# Patient Record
Sex: Female | Born: 1980 | Race: White | Hispanic: No | Marital: Married | Smoking: Never smoker
Health system: Southern US, Community
[De-identification: ages and names within clinical notes are randomized; demographics above are authoritative.]

## PROBLEM LIST (undated history)

## (undated) DIAGNOSIS — Z789 Other specified health status: Secondary | ICD-10-CM

## (undated) HISTORY — PX: NO PAST SURGERIES: SHX2092

## (undated) HISTORY — PX: OTHER SURGICAL HISTORY: SHX169

---

## 2015-02-15 ENCOUNTER — Ambulatory Visit (INDEPENDENT_AMBULATORY_CARE_PROVIDER_SITE_OTHER): Payer: Worker's Compensation | Admitting: Family Medicine

## 2015-02-15 ENCOUNTER — Ambulatory Visit: Payer: Worker's Compensation

## 2015-02-15 VITALS — BP 108/76 | HR 62 | Temp 98.9°F | Resp 18 | Ht 65.0 in | Wt 130.8 lb

## 2015-02-15 DIAGNOSIS — S93402A Sprain of unspecified ligament of left ankle, initial encounter: Secondary | ICD-10-CM

## 2015-02-15 DIAGNOSIS — M25572 Pain in left ankle and joints of left foot: Secondary | ICD-10-CM | POA: Diagnosis not present

## 2015-02-15 LAB — POCT URINE PREGNANCY: PREG TEST UR: NEGATIVE

## 2015-02-15 MED ORDER — NAPROXEN 500 MG PO TABS: 500.0000 mg | ORAL_TABLET | Freq: Two times a day (BID) | ORAL | Status: DC

## 2015-02-15 NOTE — Patient Instructions (Signed)
Elastic Bandage and RICE WHAT DOES AN ELASTIC BANDAGE DO? Elastic bandages come in different shapes and sizes. They generally provide support to your injury and reduce swelling while you are healing, but they can perform different functions. Your health care provider will help you to decide what is best for your protection, recovery, or rehabilitation following an injury. WHAT ARE SOME GENERAL TIPS FOR USING AN ELASTIC BANDAGE?  Use the bandage as directed by the maker of the bandage that you are using.  Do not wrap the bandage too tightly. This may cut off the circulation in the arm or leg in the area below the bandage.  If part of your body beyond the bandage becomes blue, numb, cold, swollen, or is more painful, your bandage is most likely too tight. If this occurs, remove your bandage and reapply it more loosely.  See your health care provider if the bandage seems to be making your problems worse rather than better.  An elastic bandage should be removed and reapplied every 3-4 hours or as directed by your health care provider. WHAT IS RICE? The routine care of many injuries includes rest, ice, compression, and elevation (RICE therapy).  Rest Rest is required to allow your body to heal. Generally, you can resume your routine activities when you are comfortable and have been given permission by your health care provider. Ice Icing your injury helps to keep the swelling down and it reduces pain. Do not apply ice directly to your skin.  Put ice in a plastic bag.  Place a towel between your skin and the bag.  Leave the ice on for 20 minutes, 2-3 times per day. Do this for as long as you are directed by your health care provider. Compression Compression helps to keep swelling down, gives support, and helps with discomfort. Compression may be done with an elastic bandage. Elevation Elevation helps to reduce swelling and it decreases pain. If possible, your injured area should be placed at  or above the level of your heart or the center of your chest. [place]? You should seek medical care if:  You have persistent pain and swelling.  Your symptoms are getting worse rather than improving. These symptoms may indicate that further evaluation or further X-rays are needed. Sometimes, X-rays may not show a small broken bone (fracture) until a number of days later. Make a follow-up appointment with your health care provider. Ask when your X-ray results will be ready. Make sure that you get your X-ray results. WHEN SHOULD I SEEK IMMEDIATE MEDICAL CARE? You should seek immediate medical care if:  You have a sudden onset of severe pain at or below the area of your injury.  You develop redness or increased swelling around your injury.  You have tingling or numbness at or below the area of your injury that does not improve after you remove the elastic bandage.   This information is not intended to replace advice given to you by your health care provider. Make sure you discuss any questions you have with your health care provider.   Document Released: 10/12/2001 Document Revised: 01/11/2015 Document Reviewed: 12/06/2013 Elsevier Interactive Patient Education [employer].

## 2015-02-15 NOTE — Progress Notes (Signed)
Natalie Jensen 1981-04-28 34 y.o.   Chief Complaint  Patient presents with  . Ankle Injury    Right ankle. Rolled ankle and heard a crack.      Date of Injury: 02/16/2015   History of Present Illness:  Presents for evaluation of work-related complaint. Reports she was doing a jumping jack and upon coming down inverted her ankle and her a "crack." Immediately feel to the ground with right ankle pain.  Since the injury complains of pain with ambulation along with swelling on the lateral right ankle.     Review of Systems  Constitutional: Negative for fever.  Gastrointestinal: Negative for nausea.  Genitourinary: Negative.   Musculoskeletal: Positive for joint pain.  Skin: Negative for rash.  Neurological: Negative for headaches.      Allergies  Allergen Reactions  . Bee Venom Hives  . Sulfa Antibiotics Hives     Current medications reviewed and updated. Past medical history, family history, social history have been reviewed and updated.  Filed Vitals:   02/15/15 1919  BP: 108/76  Pulse: 62  Temp: 98.9 F (37.2 C)  Resp: 18     Physical Exam  Constitutional: She is well-developed, well-nourished, and in no distress. No distress.  Cardiovascular: Normal rate.   Pulmonary/Chest: Effort normal.  Musculoskeletal:       Right ankle: She exhibits swelling. She exhibits no ecchymosis and no deformity. Tenderness. Lateral malleolus, AITFL and proximal fibula tenderness found. No medial malleolus, no CF ligament, no posterior TFL and no head of 5th metatarsal tenderness found. Achilles tendon exhibits no pain.       Left ankle: Normal.  Neurological: No sensory deficit. Gait (antalgic) abnormal.  Skin: She is not diaphoretic.   [facility] reading (PRIMARY) by  Dr. Marin Comment: No acute abnormality of the ankle or fibula.  Bone fragment noted at the distal aspect of the medial malleolus. Distal pant's leg noted just superior to the ankle tib/fib views.     Natalie Jensen was seen today for ankle  injury.  Diagnoses and all orders for this visit:  Left ankle pain: 2/2 problem two. WC note provided with restrictions for 7 days.  She is to RTC in 7 days if her symptoms do not resolve.   -     DG Ankle Complete Right; Future -     DG Tibia/Fibula Right; Future -     POCT urine pregnancy -     Discontinue: naproxen (NAPROSYN) 500 MG tablet; Take 1 tablet (500 mg total) by mouth 2 (two) times daily with a meal. -     naproxen (NAPROSYN) 500 MG tablet; Take 1 tablet (500 mg total) by mouth 2 (two) times daily with a meal.  Ankle sprain, left, initial encounter

## 2015-02-18 NOTE — Progress Notes (Signed)
Agree with A/P. Dr Le 

## 2015-02-22 ENCOUNTER — Telehealth: Payer: Self-pay

## 2015-02-22 NOTE — Telephone Encounter (Addendum)
Patient was seen for workers comp on 02/15/2015. She is supposed to return to work today however she is requesting a note to return on 02/24/2015. If this can be done please fax  to [facility] or Lanier Prude at [phone].  Please advise.   [phone]

## 2015-02-22 NOTE — Telephone Encounter (Signed)
Is this ok? Does pt need to recheck?

## 2015-02-22 NOTE — Telephone Encounter (Signed)
I did not see the patient, Nash Mantis did.  She needs to come back if she is hurting that much from an ankle sprain and needs to be out that long.   Thanks, Dr Marin Comment

## 2015-02-23 ENCOUNTER — Ambulatory Visit (INDEPENDENT_AMBULATORY_CARE_PROVIDER_SITE_OTHER): Payer: Worker's Compensation | Admitting: Family Medicine

## 2015-02-23 VITALS — BP 108/68 | HR 70 | Temp 97.9°F | Resp 16 | Ht 65.0 in | Wt 133.4 lb

## 2015-02-23 DIAGNOSIS — S93401D Sprain of unspecified ligament of right ankle, subsequent encounter: Secondary | ICD-10-CM

## 2015-02-23 NOTE — Progress Notes (Addendum)
Subjective:  This chart was scribed for Natalie Haber MD,  by [name], at [facility].  This patient was seen in room 14 and the patient's care was started at 4:29 PM.   Chief Complaint  Patient presents with   Follow-up    ankle pain, left, workers comp     Patient ID: Natalie Jensen, female    DOB: [birth year], 34 y.o.   MRN: [id number]  HPI  HPI Comments: Natalie Jensen is a 34 y.o. female who presents to the [facility] for a follow up for her right ankle sprain onset 8 days ago while she was doing jumping jacks and inverted her foot.  Patient states that her ankle feels much better now but still has some tenderness.  She has been compliant with her Naproxen.   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  She is requesting a work note today. Patient is willing to return in 7 days.  She has no other concerns today.    There are no active problems to display for this patient.  History reviewed. No pertinent past medical history. History reviewed. No pertinent past surgical history. Allergies  Allergen Reactions   Bee Venom Hives   Sulfa Antibiotics Hives   Prior to Admission medications   Medication Sig Start Date End Date Taking? Authorizing Provider  naproxen (NAPROSYN) 500 MG tablet Take 1 tablet (500 mg total) by mouth 2 (two) times daily with a meal. 02/15/15  Yes Shawnee Knapp, MD   Social History   Social History   Marital Status: Married    Spouse Name: N/A   Number of Children: N/A   Years of Education: N/A   Occupational History   Not on file.   Social History Main Topics   Smoking status: Never Smoker    Smokeless tobacco: Not on file   Alcohol Use: No   Drug Use: No   Sexual Activity: Not on file   Other Topics Concern   Not on file   Social History Narrative         Review of Systems    Constitutional: Negative for fever and chills.  Gastrointestinal: Negative for nausea and vomiting.  Musculoskeletal: Negative for back pain, neck pain and neck stiffness.       Objective:   Physical Exam  Constitutional: She is oriented to person, place, and time. She appears well-developed and well-nourished. No distress.  Eyes: Pupils are equal, round, and reactive to light.  Neck: Normal range of motion.  Cardiovascular: Normal rate.   Pulmonary/Chest: Effort normal. No respiratory distress.  Neurological: She is alert and oriented to person, place, and time.  Skin: Skin is warm and dry.   mild tenderness anterior and posterior to the lateral malleolus. There is diffuse green ecchymosis around the entire right ankle without swelling. She has normal range of motion. Filed Vitals:   02/23/15 1557  BP: 108/68  Pulse: 70  Temp: 97.9 F (36.6 C)  TempSrc: Oral  Resp: 16  Height: 5\' 5"  (1.651 m)  Weight: 133 lb 6.4 oz (60.51 kg)  SpO2: 98%          Assessment & Plan:  This chart was scribed in my presence and reviewed by me personally.    ICD-9-CM ICD-10-CM   1. Right ankle sprain, subsequent encounter V58.89 S93.401D    845.00  Signed, Natalie Haber, MD    Types of activities have been reviewed with the patient at length.

## 2015-02-23 NOTE — Patient Instructions (Signed)
Return next Thursday for follow-up visit

## 2015-02-23 NOTE — Telephone Encounter (Signed)
Spoke with pt, advised her to RTC. She will come in today to see Legrand Como.

## 2015-03-02 ENCOUNTER — Ambulatory Visit (INDEPENDENT_AMBULATORY_CARE_PROVIDER_SITE_OTHER): Payer: Worker's Compensation | Admitting: Family Medicine

## 2015-03-02 VITALS — BP 88/68 | HR 48 | Temp 97.8°F | Resp 16 | Ht 65.0 in | Wt 135.0 lb

## 2015-03-02 DIAGNOSIS — S93401D Sprain of unspecified ligament of right ankle, subsequent encounter: Secondary | ICD-10-CM

## 2015-03-02 NOTE — Progress Notes (Signed)
Subjective:  This chart was scribed for Natalie Haber MD, by [name], at [facility].  This patient was seen in room 14 and the patient's care was started at 1:30 PM.    Chief Complaint  Patient presents with   Follow-up    W/C Right ankle     Patient ID: Natalie Jensen, female    DOB: 13-Nov-1980, 34 y.o.   MRN: [id number]  HPI   HPI Comments: Natalie Jensen is a 34 y.o. female who presents to the [facility] for a follow up regarding her right ankle which she was initially seen here for 15 days ago by Dr. Marin Comment. Patient feels like it is healing but feels like it is very sore with dorsiflexion.  She has not been doing any Zumba.  She has been doing cycling, body weight squats, ab class and upper body work.  She has not been taking any more Naproxen.  She feels like her ankle brace is pinching her a bit so she prefers not to use it.  She would also like a referral for an orthopedic specialist.  Patient is willing to come back for a follow up next week if she is not able to go to the orthopedist.    There are no active problems to display for this patient.  No past medical history on file. No past surgical history on file. Allergies  Allergen Reactions   Bee Venom Hives   Sulfa Antibiotics Hives   Prior to Admission medications   Medication Sig Start Date End Date Taking? Authorizing Provider  naproxen (NAPROSYN) 500 MG tablet Take 1 tablet (500 mg total) by mouth 2 (two) times daily with a meal. Patient not taking: Reported on 03/02/2015 02/15/15   Shawnee Knapp, MD   Social History   Social History   Marital Status: Married    Spouse Name: N/A   Number of Children: N/A   Years of Education: N/A   Occupational History   Not on file.   Social History Main Topics   Smoking status: Never Smoker    Smokeless tobacco: Not on file   Alcohol Use: No   Drug Use: No   Sexual Activity: Not on file   Other Topics Concern    Not on file   Social History Narrative    Review of Systems  Constitutional: Negative for fever and chills.  Respiratory: Negative for cough, choking and shortness of breath.   Gastrointestinal: Negative for nausea and vomiting.  Musculoskeletal: Negative for neck pain and neck stiffness.  Skin: Negative for rash and wound.  Neurological: Negative for syncope and speech difficulty.       Objective:   Physical Exam  Constitutional: She is oriented to person, place, and time. She appears well-developed and well-nourished. No distress.  HENT:  Head: Normocephalic.  Eyes: Pupils are equal, round, and reactive to light.  Neck: Normal range of motion.  Cardiovascular: Normal rate.   Pulmonary/Chest: Effort normal. No respiratory distress.  Musculoskeletal: Normal range of motion.  Tender anterior to the lateral malleolus.   Neurological: She is alert and oriented to person, place, and time.  Skin: Skin is warm and dry.  Psychiatric: She has a normal mood and affect. Her behavior is normal.  Nursing note and vitals reviewed.  Filed Vitals:   03/02/15 1242  BP: 88/68  Pulse: 48  Temp: 97.8 F (36.6 C)  TempSrc: Oral  Resp: 16  Height: 5\' 5"  (1.651  m)  Weight: 135 lb (61.236 kg)  SpO2: 98%    Fading ecchymosis over the lateral malleolus with no bony abnormality      Assessment & Plan:   This chart was scribed in my presence and reviewed by me personally.    ICD-9-CM ICD-10-CM   1. Right ankle sprain, subsequent encounter V58.89 S93.401D Ambulatory referral to Orthopedic Surgery   845.00     Follow-up one week  Signed, Natalie Haber, MD

## 2015-03-08 ENCOUNTER — Ambulatory Visit (INDEPENDENT_AMBULATORY_CARE_PROVIDER_SITE_OTHER): Payer: Worker's Compensation | Admitting: Family Medicine

## 2015-03-08 VITALS — BP 100/68 | HR 62 | Temp 98.4°F | Resp 18 | Ht 65.0 in | Wt 133.0 lb

## 2015-03-08 DIAGNOSIS — S93401D Sprain of unspecified ligament of right ankle, subsequent encounter: Secondary | ICD-10-CM

## 2015-03-08 NOTE — Progress Notes (Signed)
   This chart was scribed for [name], MD by Moises Blood, medical scribe at [facility].The patient was seen in exam room 12 and the patient's care was started at 2:31 PM.  Patient ID: Natalie Jensen MRN: [id number], DOB: 02-28-1981, 34 y.o. Date of Encounter: 03/08/2015  Primary Physician: Tawanna Solo, MD  Chief Complaint:  Chief Complaint  Patient presents with  . Follow-up    rt ankle injury  . Ankle Injury    HPI:  Natalie Jensen is a 34 y.o. female who presents to [facility] for follow up of right ankle injury with Workers comp. She initially injured her foot while doing jumping jacks and inverted her foot. I last saw her a week ago.  She was in a dance class yesterday and still feels some pain in her right ankle.   She has not gotten a call from orthopedic.   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   Allergies:  Allergies  Allergen Reactions  . Bee Venom Hives  . Sulfa Antibiotics Hives     Review of Systems: Constitutional: negative for fever, chills, night sweats, weight changes, or fatigue  HEENT: negative for vision changes, hearing loss, congestion, rhinorrhea, ST, epistaxis, or sinus pressure Cardiovascular: negative for chest pain or palpitations Respiratory: negative for hemoptysis, wheezing, shortness of breath, or cough Abdominal: negative for abdominal pain, nausea, vomiting, diarrhea, or constipation Dermatological: negative for rash Neurologic: negative for headache, dizziness, or syncope Musc: positive for arthralgia (right ankle) All other systems reviewed and are otherwise negative with the exception to those above and in the HPI.  Physical Exam: Blood pressure 100/68, pulse 62, temperature 98.4 F (36.9 C), temperature source Oral, resp. rate 18, height 5\' 5"  (1.651 m), weight 133 lb (60.328 kg), last menstrual period 07/12/2014, SpO2 97 %., Body mass index is 22.13  kg/(m^2). General: Well developed, well nourished, in no acute distress. Head: Normocephalic, atraumatic, eyes without discharge, sclera non-icteric, nares are without discharge. Bilateral auditory canals clear, TM's are without perforation, pearly grey and translucent with reflective cone of light bilaterally. Oral cavity moist, posterior pharynx without exudate, erythema, peritonsillar abscess, or post nasal drip.  Neck: Supple. No thyromegaly. Full ROM. No lymphadenopathy. Lungs: Clear bilaterally to auscultation without wheezes, rales, or rhonchi. Breathing is unlabored. Heart: RRR with S1 S2. No murmurs, rubs, or gallops appreciated. Abdomen: Soft, non-tender, non-distended with normoactive bowel sounds. No hepatomegaly. No rebound/guarding. No obvious abdominal masses. Msk:  Strength and tone normal for age. Extremities/Skin: Warm and dry. Right ankle: mild swelling just anterior to distal fibula, pain with extreme dorsi flexion of the ankle  Neuro: Alert and oriented X 3. Moves all extremities spontaneously. Gait is normal. CNII-XII grossly in tact. Psych:  Responds to questions appropriately with a normal affect.     ASSESSMENT AND PLAN:  34 y.o. year old female with  This chart was scribed in my presence and reviewed by me personally.    ICD-9-CM ICD-10-CM   1. Right ankle sprain, subsequent encounter V58.89 S93.401D Ambulatory referral to Physical Therapy   845.00      By signing my name below, I, Moises Blood, attest that this documentation has been prepared under the direction and in the presence of [name], MD. Electronically Signed: Moises Blood, [place]. 03/08/2015 , 2:31 PM .  Signed, [name], MD 03/08/2015 2:31 PM

## 2015-03-09 ENCOUNTER — Telehealth: Payer: Self-pay

## 2015-03-09 NOTE — Telephone Encounter (Signed)
At the patient's request, I made a copy of her x ray on CD and placed in the pick up drawer.

## 2015-03-09 NOTE — Telephone Encounter (Signed)
Request printed and taken to x-ray. Per Rodi, canopy is down and unable to complete request at this time. When it gets fixed, she will make x-ray disc.

## 2015-03-09 NOTE — Telephone Encounter (Signed)
Patient is scheduled to see an ortho, and will need to take xray disc with her Please make an xray disc of the RT Ankle and RT Tibia/Fibula done on 02/15/15. She will pick up today at 5:00

## 2017-05-06 NOTE — L&D Delivery Note (Signed)
Delivery Note At 12:32 PM a viable female was delivered via  (Presentation: OA ).  APGAR: 8,9 ; weight: pending.   Placenta status: to L&D.  Cord:  with the following complications: .  Cord pH: n/a  Anesthesia:  epidural Episiotomy:  n/a Lacerations:  1st degree laceration Suture Repair: 3.0 vicryl Est. Blood Loss (mL):  50cc  Mom to postpartum.  Baby to Couplet care / Skin to Skin.  [name] 01/22/2018, 12:52 PM

## 2017-07-02 LAB — OB RESULTS CONSOLE ABO/RH: RH TYPE: POSITIVE

## 2017-07-02 LAB — OB RESULTS CONSOLE GC/CHLAMYDIA
Chlamydia: NEGATIVE
GC PROBE AMP, GENITAL: NEGATIVE

## 2017-07-02 LAB — OB RESULTS CONSOLE RUBELLA ANTIBODY, IGM: RUBELLA: IMMUNE

## 2017-07-02 LAB — OB RESULTS CONSOLE HIV ANTIBODY (ROUTINE TESTING): HIV: NONREACTIVE

## 2017-07-02 LAB — OB RESULTS CONSOLE RPR: RPR: NONREACTIVE

## 2017-07-02 LAB — OB RESULTS CONSOLE HEPATITIS B SURFACE ANTIGEN: HEP B S AG: NEGATIVE

## 2017-07-02 LAB — OB RESULTS CONSOLE ANTIBODY SCREEN: Antibody Screen: NEGATIVE

## 2018-01-09 ENCOUNTER — Encounter (HOSPITAL_COMMUNITY): Payer: Self-pay | Admitting: *Deleted

## 2018-01-09 ENCOUNTER — Telehealth (HOSPITAL_COMMUNITY): Payer: Self-pay | Admitting: *Deleted

## 2018-01-09 NOTE — Telephone Encounter (Signed)
Preadmission screen  

## 2018-01-21 LAB — OB RESULTS CONSOLE GBS: GBS: POSITIVE

## 2018-01-21 NOTE — H&P (Signed)
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  Prenatal care has been provided by Dr. Nelda Marseille  ROS: no HA, no epigastric pain, no visual changes.    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- pt has been followed by serial growths and NST weekly Last [place] @ 39wk-vertex/anterior, 6#4oz- AGA 2) AMA- genetic testing normal, normal anatomy scan except for 2 vessel cord 3) GBS positive- plan for PCN in labor 4) IVF pregnancy  Prenatal Transfer Tool  Maternal Diabetes: No Genetic Screening: Normal Maternal Ultrasounds/Referrals: Normal Fetal Ultrasounds or other Referrals:  None Maternal Substance Abuse:  No Significant Maternal Medications:  None Significant Maternal Lab Results: Lab values include: Group B Strep positive   PNL:  GBS positive, Rub Immune, Hep B neg, RPR NR, HIV neg, GC/C neg, glucola:abnormal, 3hr wnl Hgb: 13.3 Blood type: A positive, antibody neg  Immunizations: Tdap: 7/25 Flu: 9/3  OBHx: primip PMHx:  Secondary amenorrhea Meds:  PNV Allergy:   Allergies  Allergen Reactions  . Bee Venom Hives  . Sulfa Antibiotics Hives   SurgHx: none SocHx:   no Tobacco, no  EtOH, no Illicit Drugs  O: LMP 16/02/9603  Gen. AAOx3, NAD CV.  RRR  No murmur.  Resp. CTAB, no wheeze or crackles. Abd. Gravid,  no tenderness,  no rigidity,  no guarding Extr.  no edema B/L , no calf tenderness, neg Homan's B/L FHT: 140 by doppler SVE: 1/50/-2, vertex   Labs: see orders  A/P:  37 y.o. 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

## 2018-01-21 NOTE — H&P (Deleted)
  The note originally documented on this encounter has been moved the the encounter in which it belongs.  

## 2018-01-22 ENCOUNTER — Other Ambulatory Visit: Payer: Self-pay

## 2018-01-22 ENCOUNTER — Encounter (HOSPITAL_COMMUNITY): Payer: Self-pay

## 2018-01-22 ENCOUNTER — Inpatient Hospital Stay (HOSPITAL_COMMUNITY)
Admission: RE | Admit: 2018-01-22 | Discharge: 2018-01-24 | DRG: 807 | Disposition: A | Discharge status: Home / Self Care | Payer: No Typology Code available for payment source | Attending: Obstetrics & Gynecology | Admitting: Obstetrics & Gynecology

## 2018-01-22 ENCOUNTER — Inpatient Hospital Stay (HOSPITAL_COMMUNITY)
Admission: AD | Admit: 2018-01-22 | Payer: No Typology Code available for payment source | Source: Ambulatory Visit | Admitting: Obstetrics & Gynecology

## 2018-01-22 DIAGNOSIS — O99824 Streptococcus B carrier state complicating childbirth: Secondary | ICD-10-CM | POA: Diagnosis present

## 2018-01-22 DIAGNOSIS — O09899 Supervision of other high risk pregnancies, unspecified trimester: Secondary | ICD-10-CM

## 2018-01-22 DIAGNOSIS — Z3A39 39 weeks gestation of pregnancy: Secondary | ICD-10-CM | POA: Diagnosis not present

## 2018-01-22 DIAGNOSIS — Z3493 Encounter for supervision of normal pregnancy, unspecified, third trimester: Secondary | ICD-10-CM

## 2018-01-22 HISTORY — DX: Other specified health status: Z78.9

## 2018-01-22 LAB — RPR: RPR: NONREACTIVE

## 2018-01-22 LAB — CBC
HCT: 37.8 % (ref 36.0–46.0)
Hemoglobin: 13 g/dL (ref 12.0–15.0)
MCH: 33.2 pg (ref 26.0–34.0)
MCHC: 34.4 g/dL (ref 30.0–36.0)
MCV: 96.7 fL (ref 78.0–100.0)
PLATELETS: 244 10*3/uL (ref 150–400)
RBC: 3.91 MIL/uL (ref 3.87–5.11)
RDW: 12.5 % (ref 11.5–15.5)
WBC: 9.2 10*3/uL (ref 4.0–10.5)

## 2018-01-22 LAB — ABO/RH: ABO/RH(D): A POS

## 2018-01-22 LAB — TYPE AND SCREEN
ABO/RH(D): A POS
ANTIBODY SCREEN: NEGATIVE

## 2018-01-22 MED ORDER — COCONUT OIL OIL: 1.0000 "application " | TOPICAL_OIL | Status: DC | PRN

## 2018-01-22 MED ORDER — ONDANSETRON HCL 4 MG PO TABS: 4.0000 mg | ORAL_TABLET | ORAL | Status: DC | PRN

## 2018-01-22 MED ORDER — SENNOSIDES-DOCUSATE SODIUM 8.6-50 MG PO TABS
2.0000 | ORAL_TABLET | ORAL | Status: DC
  Administered 2018-01-22 – 2018-01-23 (×2): 2 via ORAL
  Filled 2018-01-22 (×2): qty 2

## 2018-01-22 MED ORDER — LACTATED RINGERS IV SOLN
INTRAVENOUS | Status: DC
  Administered 2018-01-22 (×2): via INTRAVENOUS

## 2018-01-22 MED ORDER — ACETAMINOPHEN 325 MG PO TABS: 650.0000 mg | ORAL_TABLET | ORAL | Status: DC | PRN

## 2018-01-22 MED ORDER — LIDOCAINE HCL (PF) 1 % IJ SOLN
30.0000 mL | INTRAMUSCULAR | Status: DC | PRN
  Filled 2018-01-22: qty 30

## 2018-01-22 MED ORDER — LACTATED RINGERS IV SOLN: 500.0000 mL | INTRAVENOUS | Status: DC | PRN

## 2018-01-22 MED ORDER — ZOLPIDEM TARTRATE 5 MG PO TABS: 5.0000 mg | ORAL_TABLET | Freq: Every evening | ORAL | Status: DC | PRN

## 2018-01-22 MED ORDER — SIMETHICONE 80 MG PO CHEW: 80.0000 mg | CHEWABLE_TABLET | ORAL | Status: DC | PRN

## 2018-01-22 MED ORDER — OXYCODONE-ACETAMINOPHEN 5-325 MG PO TABS: 2.0000 | ORAL_TABLET | ORAL | Status: DC | PRN

## 2018-01-22 MED ORDER — OXYTOCIN 40 UNITS IN LACTATED RINGERS INFUSION - SIMPLE MED: 1.0000 m[IU]/min | INTRAVENOUS | Status: DC

## 2018-01-22 MED ORDER — TERBUTALINE SULFATE 1 MG/ML IJ SOLN
0.2500 mg | Freq: Once | INTRAMUSCULAR | Status: DC | PRN
  Filled 2018-01-22: qty 1

## 2018-01-22 MED ORDER — ACETAMINOPHEN 325 MG PO TABS
650.0000 mg | ORAL_TABLET | ORAL | Status: DC | PRN
  Administered 2018-01-22: 650 mg via ORAL
  Filled 2018-01-22: qty 2

## 2018-01-22 MED ORDER — PENICILLIN G 3 MILLION UNITS IVPB - SIMPLE MED
3.0000 10*6.[IU] | INTRAVENOUS | Status: DC
  Administered 2018-01-22 (×2): 3 10*6.[IU] via INTRAVENOUS
  Filled 2018-01-22 (×4): qty 100

## 2018-01-22 MED ORDER — PRENATAL MULTIVITAMIN CH
1.0000 | ORAL_TABLET | Freq: Every day | ORAL | Status: DC
  Administered 2018-01-23 – 2018-01-24 (×2): 1 via ORAL
  Filled 2018-01-22 (×2): qty 1

## 2018-01-22 MED ORDER — ONDANSETRON HCL 4 MG/2ML IJ SOLN: 4.0000 mg | Freq: Four times a day (QID) | INTRAMUSCULAR | Status: DC | PRN

## 2018-01-22 MED ORDER — WITCH HAZEL-GLYCERIN EX PADS
1.0000 "application " | MEDICATED_PAD | CUTANEOUS | Status: DC | PRN
  Administered 2018-01-23: 1 via TOPICAL

## 2018-01-22 MED ORDER — OXYCODONE-ACETAMINOPHEN 5-325 MG PO TABS: 1.0000 | ORAL_TABLET | ORAL | Status: DC | PRN

## 2018-01-22 MED ORDER — DIPHENHYDRAMINE HCL 25 MG PO CAPS: 25.0000 mg | ORAL_CAPSULE | Freq: Four times a day (QID) | ORAL | Status: DC | PRN

## 2018-01-22 MED ORDER — ONDANSETRON HCL 4 MG/2ML IJ SOLN: 4.0000 mg | INTRAMUSCULAR | Status: DC | PRN

## 2018-01-22 MED ORDER — DIBUCAINE 1 % RE OINT
1.0000 "application " | TOPICAL_OINTMENT | RECTAL | Status: DC | PRN
  Administered 2018-01-23: 1 via RECTAL
  Filled 2018-01-22: qty 28

## 2018-01-22 MED ORDER — FENTANYL CITRATE (PF) 100 MCG/2ML IJ SOLN: 50.0000 ug | INTRAMUSCULAR | Status: DC | PRN

## 2018-01-22 MED ORDER — SODIUM CHLORIDE 0.9 % IV SOLN
5.0000 10*6.[IU] | Freq: Once | INTRAVENOUS | Status: AC
  Administered 2018-01-22: 5 10*6.[IU] via INTRAVENOUS
  Filled 2018-01-22: qty 5

## 2018-01-22 MED ORDER — OXYTOCIN BOLUS FROM INFUSION
500.0000 mL | Freq: Once | INTRAVENOUS | Status: AC
  Administered 2018-01-22: 500 mL via INTRAVENOUS

## 2018-01-22 MED ORDER — MISOPROSTOL 25 MCG QUARTER TABLET
25.0000 ug | ORAL_TABLET | ORAL | Status: DC | PRN
  Administered 2018-01-22 (×2): 25 ug via VAGINAL
  Filled 2018-01-22 (×3): qty 1

## 2018-01-22 MED ORDER — BENZOCAINE-MENTHOL 20-0.5 % EX AERO
1.0000 "application " | INHALATION_SPRAY | CUTANEOUS | Status: DC | PRN
  Administered 2018-01-22: 1 via TOPICAL
  Filled 2018-01-22 (×2): qty 56

## 2018-01-22 MED ORDER — SOD CITRATE-CITRIC ACID 500-334 MG/5ML PO SOLN: 30.0000 mL | ORAL | Status: DC | PRN

## 2018-01-22 MED ORDER — IBUPROFEN 600 MG PO TABS
600.0000 mg | ORAL_TABLET | Freq: Four times a day (QID) | ORAL | Status: DC
  Administered 2018-01-22 – 2018-01-24 (×8): 600 mg via ORAL
  Filled 2018-01-22 (×8): qty 1

## 2018-01-22 MED ORDER — OXYTOCIN 40 UNITS IN LACTATED RINGERS INFUSION - SIMPLE MED
2.5000 [IU]/h | INTRAVENOUS | Status: DC
  Administered 2018-01-22: 2.5 [IU]/h via INTRAVENOUS
  Filled 2018-01-22: qty 1000

## 2018-01-22 NOTE — Anesthesia Pain Management Evaluation Note (Signed)
  CRNA Pain Management Visit Note  Patient: Natalie Jensen, 37 y.o., female  "Hello I am a member of the anesthesia team at [facility]. We have an anesthesia team available at all times to provide care throughout the hospital, including epidural management and anesthesia for C-section. I don't know your plan for the delivery whether it a natural birth, water birth, IV sedation, nitrous supplementation, doula or epidural, but we want to meet your pain goals."   1.Was your pain managed to your expectations on prior hospitalizations?   No prior hospitalizations  2.What is your expectation for pain management during this hospitalization?     Labor support without medications, Epidural and IV pain meds  3.How can we help you reach that goal? Possible epidural  Record the patient's initial score and the patient's pain goal.   Pain: 5  Pain Goal: 8 [facility] wants you to be able to say your pain was always managed very well.  [name] 01/22/2018

## 2018-01-22 NOTE — Progress Notes (Signed)
OB PN:  S: Pt resting comfortably, feeling some mild cramping  O: BP 110/68   Pulse 74   Temp 97.8 F (36.6 C) (Axillary)   Resp 18   Ht 5\' 4"  (1.626 m)   Wt 81.6 kg   LMP 04/22/2017   BMI 30.90 kg/m   FHT: 130bpm, moderate variablity, + accels, no decels Toco: irregular SVE: 1/50/-3, per RN, cytotec #2 placed  A/P: 37 y.o. G1P0 @ [redacted]w[redacted]d for IOL 1. FWB: Cat. I 2. Labor: continue cytotec per protocol Pain: IV or epidural upon request GBS: positive, plan to start PCN once active labor or foley balloon  Janyth Pupa, DO [phone] (cell) [phone] (office)

## 2018-01-22 NOTE — Progress Notes (Signed)
OB PN:  S: Pt feeling urge to push and increased pressure  O: BP 132/79 (BP Location: Left Arm)   Pulse (!) 53   Temp 97.7 F (36.5 C) (Oral)   Resp (!) 22   Ht 5\' 4"  (1.626 m)   Wt 81.6 kg   LMP 04/22/2017   SpO2 100%   BMI 30.90 kg/m   FHT: 130bpm, moderate variablity, + accels, variable decels noted with pushing Toco: q2-37min SVE: C/C/+1  A/P: 37 y.o. G1P0 @ [redacted]w[redacted]d for IOL 1. FWB: Cat. II- overall FHT reassuring 2. Labor: s/p cytotec x 2 Pain: nitrous gas as needed GBS: positive, continue PCN  Janyth Pupa, DO [phone] (cell) [phone] (office)

## 2018-01-23 LAB — CBC
HCT: 32.3 % — ABNORMAL LOW (ref 36.0–46.0)
HEMOGLOBIN: 11.1 g/dL — AB (ref 12.0–15.0)
MCH: 33.4 pg (ref 26.0–34.0)
MCHC: 34.4 g/dL (ref 30.0–36.0)
MCV: 97.3 fL (ref 78.0–100.0)
Platelets: 201 10*3/uL (ref 150–400)
RBC: 3.32 MIL/uL — ABNORMAL LOW (ref 3.87–5.11)
RDW: 12.7 % (ref 11.5–15.5)
WBC: 9.6 10*3/uL (ref 4.0–10.5)

## 2018-01-23 NOTE — Lactation Note (Signed)
This note was copied from a baby's chart. Lactation Consultation Note  Patient Name: Natalie Jensen Date: 01/23/2018 Reason for consult: Initial assessment;1st time breastfeeding;Primapara;Term(IVF)  56 hours old FT female who is being exclusively BF by his mother, she's a P1; baby was an IVF. Mom took BF classes, here at [facility] and she already knows how to hand express. RN already set her up on NS # 24, LC resized her to # 20 but unable to assess with a feeding, baby was asleep while in [facility] consultation, he was brought from his circumsicion just an hour ago. Mom has a Medela DEBP at home.  Mom's nipples look everted upon examination, but the left one is very short shafted, per parents, her nipples looked flat yesterday and the breast shells plus the Lansinoh mini hand pump that she's been using the last 24 hours have helped to ever her nipples, they no longer look flat. 16 pediatrician noticed that he had a slight short frenulum that may be interfering with baby's feeding at the left breast (the nipple that is short shafted).  Per mom BF is going well, both of her nipples looked intact upon examination with no signs of trauma. Baby is able to latch without any lactation aid to the right nipple, he's only having trouble to latch on the left one. Offered to set up a DEBP but mom will stick to her plan for now because it's working out. She may add the Medela Harmony breast pump to her current plan to help protect her milk supply on the left nipples (the one that is using the NS # 20).  Mom voiced she can hear swallows when baby is at the breast and she also notices colostrum on NS at the end of the feeding. Parents were very engaged in [facility] consultation and had lots of questions. Mom already getting lots of colostrum, with hand expression but mostly with her Lansinoh mini hand pump.   Plan:  1. Encouraged mom to feed baby STS 8-12 times/24 hours or sooner if feedings cues are present.  2. If baby  is not cueing in a 3 hour period, bring him to the breast STS to give him an opportunity to feed. 3. Keep using NS, and try the # 20 on the next feeding, mom is aware of how to spot an ill fitting NS. 4. Pumping and finger feeding were also encouraged; especially pre-pumping prior NS fitting and prior feedings  BF brochure, BF resources and feeding diary were reviewed. Parents reported all questions were answered, they're both aware of [place] services and will call PRN.    Maternal Data Formula Feeding for Exclusion: No Has patient been taught Hand Expression?: Yes Does the patient have breastfeeding experience prior to this delivery?: No  Feeding Feeding Type: Breast Fed Length of feed: 10 min  Interventions Interventions: Breast feeding basics reviewed;Breast massage;Hand express;Breast compression;Hand pump;Shells  Lactation Tools Discussed/Used Tools: Shells;Pump;Nipple Shields Nipple shield size: 20 Shell Type: Inverted Breast pump type: Manual WIC Program: No Pump Review: Setup, frequency, and cleaning;Milk Storage Initiated by:: RN Date initiated:: 01/23/18   Consult Status Consult Status: Follow-up Date: 01/24/18 Follow-up type: In-patient    Caral Whan Francene Boyers 01/23/2018, 6:54 PM

## 2018-01-23 NOTE — Progress Notes (Signed)
Postpartum Note Day # 1  S:  Patient resting comfortable in bed.  Pain controlled.  Tolerating general diet. + flatus, no BM.  Lochia moderate.  Ambulating without difficulty.  She denies n/v/f/c, SOB, or CP.  Pt plans on breastfeeding.  O: Temp:  [97.6 F (36.4 C)-98.5 F (36.9 C)] 98.1 F (36.7 C) (09/20 0521) Pulse Rate:  [53-91] 70 (09/20 0521) Resp:  [16-22] 16 (09/20 0521) BP: (100-133)/(62-88) 117/70 (09/20 0521) SpO2:  [98 %-100 %] 100 % (09/19 1140)   Gen: A&Ox3, NAD CV: RRR, no MRG Resp: CTAB Abdomen: soft, NT, ND +BS Uterus: firm, non-tender, below umbilicus Ext: No edema, no calf tenderness bilaterally, SCDs in place  Labs:  Recent Labs    01/22/18 0218 01/23/18 0540  HGB 13.0 11.1*    A/P: Pt is a 37 y.o. G1P1001 s/p NSVD, PPD#1  - Pain well controlled -GU: voiding freely -GI: Tolerating general diet -Activity: encouraged sitting up to chair and ambulation as tolerated -Prophylaxis: early ambulation -Labs: stable as above -Baby boy circ to be completed later today  Continue with routine postpartum care  Janyth Pupa, DO [phone] (cell) [phone] (office)

## 2018-01-23 NOTE — Lactation Note (Signed)
This note was copied from a baby's chart. Lactation Consultation Note  Patient Name: Natalie Jensen KPTWS'F Date: 01/23/2018 Reason for consult: Initial assessment;1st time breastfeeding Mom asleep as LC entered room. Dad awake and will let LC know when mom is ready for assistance.  Maternal Data    Feeding    LATCH Score                   Interventions    Lactation Tools Discussed/Used     Consult Status      Vicente Serene 01/23/2018, 1:55 AM

## 2018-01-24 MED ORDER — IBUPROFEN 600 MG PO TABS: 600.0000 mg | ORAL_TABLET | Freq: Four times a day (QID) | ORAL | 0 refills | Status: DC

## 2018-01-24 NOTE — Discharge Summary (Signed)
    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 yo G1P1001 at [redacted]w[redacted]d was admitted to the hospital 01/22/2018 for induction of labor.  Indication for induction: 2 vessel cord.  Patient had an uncomplicated labor course as follows: Membrane Rupture Time/Date: 7:54 AM ,01/22/2018   Intrapartum Procedures: Episiotomy: None [1]                                         Lacerations:  1st degree [2]  Patient had delivery of a Viable infant.  Information for the patient's newborn:  Artis, Buechele [[id number]]  Delivery Method: Vag-Spont   01/22/2018  Details of delivery can be found in separate delivery note.  Patient had a routine postpartum course. Patient is discharged home 01/24/18.  Physical exam  Vitals:   01/22/18 2351 01/23/18 0521 01/23/18 1335 01/24/18 0638  BP:  117/70 120/76 127/69  Pulse: 72 70 71 75  Resp: 16 16 18 18   Temp:  98.1 F (36.7 C) 98 F (36.7 C) 98 F (36.7 C)  TempSrc:  Oral Oral Oral  SpO2:    99%  Weight:      Height:       General: alert, cooperative and no distress Lochia: appropriate Uterine Fundus: firm Incision: N/A DVT Evaluation: No evidence of DVT seen on physical exam. Labs: Lab Results  Component Value Date   WBC 9.6 01/23/2018   HGB 11.1 (L) 01/23/2018   HCT 32.3 (L) 01/23/2018    MCV 97.3 01/23/2018   PLT 201 01/23/2018   No flowsheet data found.  Discharge instruction: per After Visit Summary and "Baby and Me Booklet".  After visit meds:  Ibuprofen, PNV  Diet: routine diet  Activity: Advance as tolerated. Pelvic rest for 6 weeks.   Outpatient follow up:6 weeks Follow up Appt:No future appointments. Follow up Visit:No follow-ups on file.  Postpartum contraception: Undecided  Newborn Data: Live born female  Birth Weight: 6 lb 9.6 oz (2994 g) APGAR: 8, 9  Newborn Delivery   Birth date/time:  01/[birth year]/2018 12:32:00 Delivery type:  Vaginal, Spontaneous     Baby Feeding: Breast Disposition:home with mother   01/24/2018 Natalie Jensen, CNM

## 2018-01-24 NOTE — Lactation Note (Signed)
This note was copied from a baby's chart. Lactation Consultation Note  Patient Name: Boy Vernida Mcnicholas XTKWI'O Date: 01/24/2018 Reason for consult: Follow-up assessment;Infant weight loss  Room 108 - 1st time parents,  Randel Books is 105 hours old  LC reviewed and updated the doc flow sheets .  As LC entered the room, dad holding baby up right and spoon feeding baby EBM / and doing well.  After spoon feeding baby more awake and LC resized mom for her NS and the #24 NS was the better Fit for mom and baby. Per mom the #20 NS was getting me sore and difficult to remove and felt the #24 NS would be better. LC assisted mom , and showed both mom and dad how to insert the EBM into the top of the Nipple Shield. Baby latched with firm support and sustained latch with depth for 15 min and  Released, milk in the NS afterwards, multiple swallows, breast softened.  Baby fell asleep for a few minutes and , after RN assessment woke  back up and LC assisted to latch  On the right breast / football/ baby latched with depth after a few attempts to obtain depth.  Parents pointed out to the [facility] they noted the baby may have a short frenulum under the front of the tongue.  LC checked tongue mobility and the noted the skin notch in the mid section under the tongue, upper lip stretches with the latch well and with exam, baby was not noted to stretch the tongue gum line or raise  The tongue above the corners of the mouth.  Baby was able to latch on the the left breast with #24 NS with depth, and mom needed the NS due to edema causing the tissue to be short shaft and when tried without the baby was on and off.  On the right breast after several attempts, latched with depth without the NS. Did not feed as long.  After the baby released, nipple well rounded, LC showed parents how to finger feed the rest of the EBM .   Mom has been wearing breast shells between feedings, using comfort gels, and hand pump .  Per mom has DEBP at home.   LC reviewed sore nipple and engorgement prevention and tx reviewed. Breast are fuller bilaterally and LC praised mom for her efforts breast feeding.  LC discussed importance of STS until the baby can stay awake for a feeding and back to birth weight.  Discussed nutritive vs non - nutritive , and hanging out latched.  LC stressed to mom if her breast are full , will need to hand express , and pre-pump if needed and reverse  Pressure to make the areola more compressible so the NS fits well and to counteract the decreased  Mobility of the tongue.  LC offered to request the [facility] to call mom to set up a [place] O/P appt. Both dad and mom receptive .  LC placed a request in the [facility] Epic basket . Mom aware she will receive a call next week .    Mother informed of post-discharge support and given phone number to the lactation department, including services for phone call assistance; out-patient appointments; and breastfeeding support group. List of other breastfeeding resources in the community given in the handout. Encouraged mother to call for problems or concerns related to breastfeeding.      Maternal Data Has patient been taught Hand Expression?: Yes  Feeding Feeding Type: Breast Milk(right breast )  Length of feed: 8 min  LATCH Score Latch: Grasps breast easily, tongue down, lips flanged, rhythmical sucking.(right nipple )  Audible Swallowing: Spontaneous and intermittent  Type of Nipple: Everted at rest and after stimulation  Comfort (Breast/Nipple): Filling, red/small blisters or bruises, mild/mod discomfort  Hold (Positioning): Assistance needed to correctly position infant at breast and maintain latch.  LATCH Score: 8  Interventions Interventions: Breast feeding basics reviewed;Assisted with latch;Skin to skin;Breast massage;Hand express;Breast compression;Reverse pressure;Adjust position;Support pillows;Position options  Lactation Tools Discussed/Used Tools:  Shells;Pump Nipple shield size: Other (comment)(did not need NS ) Shell Type: Inverted Breast pump type: Manual   Consult Status Consult Status: Follow-up Date: ([place] offered to request and LC O/P at [facility] ) Follow-up type: [place] 01/24/2018, 12:39 PM

## 2018-02-05 ENCOUNTER — Ambulatory Visit: Payer: Self-pay

## 2018-02-05 NOTE — Lactation Note (Signed)
This note was copied from a baby's chart.  02/05/2018  Name: Natalie Jensen MRN: [id number] Date of Birth: 01/22/2016 Gestational Age: Gestational Age: 101w2d Birth Weight: 105.6 oz Weight today:    7 pounds 2.7 ounces (3252 grams) with clean newborn diaper   Infant presents today with mom and dad for feeding assessment.   Infant has gained 447 grams in the las t 12 days with an average daily weight gain of 37 grams a day.   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 Infant is noted to have a thin short lingual frenulum that inserts near the tip of the tongue. Infant with some limitation to elevation, lateralization and extension of the tongue. Infant biting and takes a while to organize on the breast. Infant extends tongue to the gum line or just behind the gumline when suckling on a gloved finger. Infant with thick labial frenulum that inserts at the bottom of the gum ridge, upper lip is tight on the breast and with flanging. Mom's nipple is slightly compressed post feeding. Mom's nipples are sensitive and intact. Infant with high palate. Mom has to use a nipple shield on the left nipple with some feedings. Left nipple is flatter than the right breast. Infant can take long periods to feed and tends to tire on the breast. Parents encouraged to have infant evaluated by Oral Specialist. Parents given website information on Tongue and lip restrictions.   Discussed how tongue and lip tie can effect milk supply and BF now and in the future.   Infant fed on both breasts for the feeding. He latched well, sometimes biting and needing time to organize suck. Infant can feed for long periods also.   Handout given on suck training to perform before latch.   Infant to follow up with Dr. Charolette Forward today. Family connects has seen infant with no plan for follow up. Infant to follow up with Lactation 1-5 days post tongue/lip revision if completed. Mom aware of BF Support Groups.    Praised parents for their efforts. Enc them to call with questions/concerns as needed. Parents reports all questions/concerns have been answered.     General Information: Mother's reason for visit: Feeding assessment Consult: Initial Lactation consultant: Natalie Mattes RN,IBCLC Breastfeeding experience: Improving, needs NS for left breast with some feedings Maternal medical conditions: Infertility(IVF) Maternal medications: Pre-natal vitamin, Other, Motrin (ibuprofen), Stool softener(DHA)  Breastfeeding History: Frequency of breast feeding: every 1-3 hours Duration of feeding: 30-50 minutes  Supplementation:                 Pump type: Medela pump in style Pump frequency: 5 x in the last 2 weeks    Infant Output Assessment: Voids per 24 hours: 4 Urine color: Clear yellow Stools per 24 hours: 7 Stool color: Yellow  Breast Assessment: Breast: Filling, Compressible Nipple: Erect Pain level: 2 Pain interventions: Bra, Other(Earth Mama Nipple Cream PRN)  Feeding Assessment: Infant oral assessment: Variance Infant oral assessment comment: 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 Infant is noted to have a thin short lingual frenulum that inserts near the tip of the tongue. Infant with some limitation to elevation, lateralization and extension of the tongue. Infant biting and takes a while to organize on the breast. Infant extends tongue to the gum line or just behind the gumline when suckling on a gloved finger. Infant with thick labial frenulum that inserts at the bottom of the gum ridge, upper lip is  tight on the breast and with flanging. Mom's nipple is slightly compressed post feeding. Mom's nipples are sensitive and intact. Infant with high palate. Mom has to use a nipple shield on the left nipple with some feedings. Left nipple is flatter than the right breast. Infant can take long periods to feed and tends to tire on the breast. Positioning: Cross  cradle(right breast, 17 mintues) Latch: 2 - Grasps breast easily, tongue down, lips flanged, rhythmical sucking. Audible swallowing: 2 - Spontaneous and intermittent Type of nipple: 2 - Everted at rest and after stimulation Comfort: 1 - Filling, red/small blisters or bruises, mild/mod discomfort Hold: 2 - No assistance needed to correctly position infant at breast LATCH score: 9 Latch assessment: Deep Lips flanged: No(upper lip needs flanging) Suck assessment: Displays both   Pre-feed weight: 3252 grams Post feed weight: 3290 grams Amount transferred: 38 ml Amount supplemented: 0  Additional Feeding Assessment: Infant oral assessment: Variance Infant oral assessment comment: see above Positioning: Football(left breast, 18 mintues) Latch: 1 - Repeated attempts neede to sustain latch, nipple held in mouth throughout feeding, stimulation needed to elicit sucking reflex. Audible swallowing: 2 - Spontaneous and intermittent Type of nipple: 2 - Everted at rest and after stimulation Comfort: 1 - Filling, red/small blisters or bruises, mild/mod discomfort Hold: 2 - No assistance needed to correctly position infant at breast LATCH score: 8 Latch assessment: Deep Lips flanged: Yes Suck assessment: Displays both   Pre-feed weight: 3290 grams Post feed weight: 3328 grams Amount transferred: 38 ml Amount supplemented: 0  Totals: Total amount transferred: 76 ml + ate a few more minutes after redressing Total supplement given: 0 Total amount pumped post feed: ddi not pump   Plan:  1. Offer infant the breast with feeding cues, feed infant as long as he wants 2. Use the Nipple Shield with feeding as needed 3. Keep infant awake at the breast with feeding 4. Massage/compress breast with feeding 5. Support Breast with feeding.  5. Pump both breasts 2-3 x a day with double electric breast pump for 10-15 minutes to protect milk supply 6. Infant needs about 60-80 ml (2-2.75 ounces) for 8  feedings a day or 480-640 ml (16-21 ounces) in 24 hours. He may eat more or less depending on how often he feeds 7. Would recommend you have infant evaluated by Oral Specialist 8. Suck training prior to each latch for 1-2 minutes per handout 9. Keep up the good work 73. Thank you for allowing me to assist you today 11. Please call with any questions/concerns as needed [phone] 12. Follow up with Lactation 1-5 days post tongue/lip revision if completed.    Debby Freiberg Natalie Linskey RN, IBCLC                                                     Debby Freiberg Natalie Jensen 02/05/2018, 8:45 AM

## 2018-02-13 ENCOUNTER — Ambulatory Visit: Payer: Self-pay

## 2018-02-13 NOTE — Lactation Note (Signed)
This note was copied from a baby's chart.  02/13/2018  Name: Natalie Jensen MRN: [id number] Date of Birth: 01/22/2016 Gestational Age: Gestational Age: [redacted]w[redacted]d Birth Weight: 105.6 oz Weight today:    7 pounds 11.1 ounces (3488 grams) with clean newborn diaper   Franchot Erichsen and a 59 week old Infant presents today with mom and dad for follow up feeding assessment post tongue and lip releases on 10/8 by Dr. [name].   Infant has gained 236 grams in the last 9 days with an average daily weight gain of 26 grams a day.   Mom reports infant has had difficulty with sleeping and discomfort the last few days. They are giving Tylenol prn.   Infant with triangle shaped granulation tissue to upper lip. Upper lip with more elasticity. Infant with diamond shape under tongue, infant wants to roll tongue under when trying to do stretches. Infant with better tongue mobility today. Infant with more tongue extension, elevation and lateralization. Infant with strong suckle on gloved finger with good tongue extension and cupping.  Enc parents to start suck training exercised 5-6 x a day for 1-2 minutes each exercise ( Finger Sucking, Down and out stroking and Lateralizing)  Infant fed on both breast well. He was noted to have more chomping when he became tired. Enc mom to use suck training prior to latch to see if that helps. Mom with minimal pain with feeding but some nipple compression post feeding  Reviewed pumping, returning to work and offering bottles. Enc parents to review paced bottle feeding prior to bottle feeding and to use Their Dr. Saul Fordyce vs Comotomo bottles.   Infant to follow up with Dr. [name] on Oct 22. Infant to follow up with Dr. Charolette Forward on Oct 22. Infant to follow up with Lactation as needed and within 2-3 weeks if feeding are not improving.     General Information: Mother's reason for visit: follow up feeding assessment post tongue/lip releases on 10/8 Consult:  Follow-up Lactation consultant: Nonah Mattes RN,IBCLC Breastfeeding experience: feeding are going well, some increased soreness in mom, some fussiness in infant Maternal medical conditions: Infertility(IVF infant) Maternal medications: Pre-natal vitamin, Motrin (ibuprofen), Stool softener, Other(DHA)  Breastfeeding History: Frequency of breast feeding: every 1-2 hours (11 feeds yesterday) Duration of feeding: 17-28 minutes per side  Supplementation:                 Pump type: Medela pump in style Pump frequency: 1-2 x a day Pump volume: 5 ounces  Infant Output Assessment: Voids per 24 hours: 8 Urine color: Clear yellow Stools per 24 hours: 5 Stool color: Yellow  Breast Assessment: Breast: Soft, Compressible Nipple: Erect, Other(compressed post feeding left more that right) Pain level: 3(with feeding) Pain interventions: Bra  Feeding Assessment: Infant oral assessment: Variance Infant oral assessment comment: Infant with triangle shaped granulation tissue to upper lip. Upper lip with more elasticity. Infant with diamond shape under tongue, infant wants to roll tongue under when trying to do stretches. Infant with better tongue mobility today. Infant with more tongue extension, elevation and lateralization. Infant with strong suckle on gloved finger with good tongue extension and cupping.  Positioning: Cross cradle(right breast, 14 minutes) Latch: 2 - Grasps breast easily, tongue down, lips flanged, rhythmical sucking. Audible swallowing: 2 - Spontaneous and intermittent Type of nipple: 2 - Everted at rest and after stimulation Comfort: 2 - Soft/non-tender Hold: 2 - No assistance needed to correctly position infant at breast LATCH score: 10 Latch assessment: Deep Lips flanged:  No(upper lip needs flanging) Suck assessment: Displays both   Pre-feed weight: 3488 grams Post feed weight: 3524 grams Amount transferred: 36 ml Amount supplemented: 0  Additional Feeding  Assessment: Infant oral assessment: Variance Infant oral assessment comment: see above Positioning: Cross cradle(left nipple) Latch: 2 - Grasps breast easily, tongue down, lips flanged, rhythmical sucking. Audible swallowing: 2 - Spontaneous and intermittent Type of nipple: 2 - Everted at rest and after stimulation Comfort: 2 - Soft/non-tender Hold: 2 - No assistance needed to correctly position infant at breast LATCH score: 10 Latch assessment: Deep Lips flanged: No(upper lip needed flanging) Suck assessment: Displays both   Pre-feed weight: 3524 grams Post feed weight: 3552 grams Amount transferred: 28 ml Amount supplemented: 0  Totals: Total amount transferred: 64 ml + ate more after getting dressed Total supplement given: 0 Total amount pumped post feed: did not pump   Plan:  1. Offer infant the breast with feeding cues, feed infant as long as he wants 2. Use the Nipple Shield with feeding as needed 3. Keep infant awake at the breast with feeding 4. Massage/compress breast with feeding 5. Support Breast with feeding  5. Pump both breasts 2-3 x a day with double electric breast pump for 10-15 minutes to protect milk supply 6. Infant needs about 64-85 ml (2-3 ounces) for 8 feedings a day or 510-680 ml (17-23 ounces) in 24 hours. He may eat more or less depending on how often he feeds 7. Offer a bottle between 4-6 weeks 8. Try your Dr. Saul Fordyce bottle for feeding 9. Use the Paced bottle feeding for feeding (video on [email or web address]) 10. Continue stretches per Dr. [name]  11. Suck training prior to each latch for 1-2 minutes per handout and then 5-6 x a day for 1-2 minutes per exercise for 2-3 weeks.  12. Keep up the good work 55. Thank you for allowing me to assist you today 14. Please call with any questions/concerns as needed [phone] 15. Follow up with Lactation on October 24 th    [place],  Tanda Rockers Hice 02/13/2018, 10:39 AM

## 2018-02-26 ENCOUNTER — Ambulatory Visit: Payer: Self-pay

## 2018-02-26 NOTE — Lactation Note (Signed)
This note was copied from a baby's chart. 02/26/2018  Name: Natalie Jensen MRN: [id number] Date of Birth: 01/22/2016 Gestational Age: Gestational Age: [redacted]w[redacted]d Birth Weight: 105.6 oz Weight today:    8 pounds 15.7 ounces (4074 grams) with clean newborn diaper  Infant presents today with both parents for follow up feeding assessment.   Infant has gained 586 grams in the last 13 days with an average daily weight gain of 45 grams a day.   Infant is feeding well per mom. Infant feeds about every 2-2.5 hours around the clock. Parents want him to go longer between feeds, discussed that some infants need a little longer to be able to space out feedings. Mom is using the NS rarely. She denies pain with feeding.   Infant had follow up with Dr. [name] this week. Infant is healing well per Dr. [name]. Parents are to continue his tongue stretches for another week.   Discussed pumping and returning to work.   Infant fed well on both breasts. Mom with minimal pain with feeding. Mom with minimal compression to the nipple.  Infant to follow up with Ped on Nov. 19. Mom aware of BF Support Groups. Infant to follow up with Lactation as needed.   Parents report all questions have been answered.     General Information: Mother's reason for visit: Follow up feeding assessment Consult: Follow-up Lactation consultant: Nonah Mattes RN,IBCLC Breastfeeding experience: Going well, infant eats frequently Maternal medical conditions: Infertility(IVF infant ) Maternal medications: Pre-natal vitamin  Breastfeeding History: Frequency of breast feeding: every 2-2.5 hours Duration of feeding: 18-20 minutes  Supplementation: Supplement method: bottle(Dr. Brown's )         Breast milk volume: 1-2 ounces Breast milk frequency: once a day Total breast milk volume per day: 1-2 ounces Pump type: Medela pump in style Pump frequency: 1-3 x a day Pump volume: 2-3 ounces  Infant Output  Assessment: Voids per 24 hours: 11 Urine color: Clear yellow Stools per 24 hours: 8 Stool color: Yellow  Breast Assessment: Breast: Soft, Compressible Nipple: Erect Pain level: 1 Pain interventions: Bra, Other(Nipple Butter)  Feeding Assessment: Infant oral assessment: Variance Infant oral assessment comment: Infant with healed upper lip, upper lip flanges well. Infant with healing to incision under tongue, tongue is difficulty to get under to elevate, parents are continuing stretches. Infant with strong suckle and good tongue mobility. mom with less nipple compression post feeding today and less pain.  Positioning: Cross cradle(right breast, 15 minutes) Latch: 2 - Grasps breast easily, tongue down, lips flanged, rhythmical sucking. Audible swallowing: 2 - Spontaneous and intermittent Type of nipple: 2 - Everted at rest and after stimulation Comfort: 1 - Filling, red/small blisters or bruises, mild/mod discomfort Hold: 2 - No assistance needed to correctly position infant at breast LATCH score: 9 Latch assessment: Deep Lips flanged: Yes Suck assessment: Displays both   Pre-feed weight: 4074 grams Post feed weight: 4106 grams Amount transferred: 32 ml Amount supplemented: 0  Additional Feeding Assessment: Infant oral assessment: Variance Infant oral assessment comment: see above Positioning: Cross cradle(left breast, 10 minutes) Latch: 2 - Grasps breast easily, tongue down, lips flanged, rhythmical sucking. Audible swallowing: 2 - Spontaneous and intermittent Type of nipple: 2 - Everted at rest and after stimulation Comfort: 1 - Filling, red/small blisters or bruises, mild/mod discomfort Hold: 2 - No assistance needed to correctly position infant at breast LATCH score: 9 Latch assessment: Deep Lips flanged: No Suck assessment: Displays both   Pre-feed weight: 4106 grams  Post feed weight: 4120 grams Amount transferred: 14 ml Amount supplemented: 0  Totals: Total  amount transferred: 46 ml, infant ate  Total supplement given: 0 Total amount pumped post feed: did not pump here   Plan:  1. Offer infant the breast with feeding cues, feed infant as long as he wants 2. Keep infant awake at the breast with feeding 3. Massage/compress breast with feeding 4. Support Breast with feeding  5. Pump both breasts 2-3 x a day with double electric breast pump for 10-15 minutes to protect milk supply 6. Infant needs about 75-100 ml (2.5-3.3 ounces) for 8 feedings a day or 600-800 ml (20-26 ounces) in 24 hours. He may eat more or less depending on how often he feeds 7. Offer a bottle between 4-6 weeks 8. Try your Dr. Saul Fordyce bottle for feeding 9. Use the Paced bottle feeding for feeding (video on [email or web address]) 10. Continue stretches per Dr. [name]  11. Suck training prior to each latch for 1-2 minutesper handout and then 5-6 x a day for 1-2 minutes per exercise for 2-3 weeks.  12. Keep up the good work 78. Thank you for allowing me to assist you today 14. Please call with any questions/concerns as needed [phone] 15. Follow up with Lactation as needed   [facility] Consultant RN, IBCLC                                                           Natalie Jensen Natalie Jensen 02/26/2018, 8:41 AM

## 2018-06-15 ENCOUNTER — Ambulatory Visit: Payer: No Typology Code available for payment source | Admitting: Sports Medicine

## 2018-06-15 ENCOUNTER — Ambulatory Visit
Admission: RE | Admit: 2018-06-15 | Discharge: 2018-06-15 | Disposition: A | Discharge status: Home / Self Care | Payer: No Typology Code available for payment source | Source: Ambulatory Visit | Attending: Sports Medicine | Admitting: Sports Medicine

## 2018-06-15 ENCOUNTER — Encounter: Payer: Self-pay | Admitting: Sports Medicine

## 2018-06-15 VITALS — BP 90/60 | Ht 64.0 in | Wt 147.0 lb

## 2018-06-15 DIAGNOSIS — M25562 Pain in left knee: Secondary | ICD-10-CM

## 2018-06-16 ENCOUNTER — Encounter: Payer: Self-pay | Admitting: Sports Medicine

## 2018-06-16 NOTE — Progress Notes (Signed)
   Subjective:    Patient ID: Natalie Jensen, female    DOB: Feb 27, 1981, 38 y.o.   MRN: [id number]  HPI chief complaint: Left knee pain  Very pleasant and very active 38 year old female comes in today complaining of 4 weeks of left knee pain.  She does not recall any specific injury but rather describes a gradual onset of pain that is primarily along the medial aspect of the knee.  It is to the point now that she has pain not only with exercise but with prolonged standing.  She rates it as a 5/10 on the pain scale.  She has noticed some mild swelling as well.  She endorses some occasional catching in the knee.  No locking.  No numbness or tingling.  She denies previous problems with the knee in the past.  No prior knee surgeries.  She takes occasional over-the-counter anti-inflammatories with limited symptom relief.  She denies pain more proximally at the hip.  Past medical history reviewed Medications reviewed Allergies reviewed    Review of Systems    As above Objective:   Physical Exam  Well-developed, fit appearing.  No acute distress.  Awake alert and oriented x3.  Vital signs reviewed  Left knee: Range of motion is 0 to 130 degrees.  Trace effusion.  She is tender to palpation along the medial joint line as well as along the proximal medial tibia.  Positive McMurray's.  Equivocal Thessaly's.  Knee is stable to ligamentous exam but there is pain with MCL stressing.  Neurovascularly intact distally.  Bedside ultrasound shows a trace effusion.  Bulging medial meniscus without obvious tear.  X-rays of the left knee including AP, lateral, and sunrise views are unremarkable      Assessment & Plan:   Left knee pain worrisome for medial meniscal tear versus possible proximal tibial stress fracture  MRI of the left knee specifically to rule out a meniscal tear or possibly a proximal tibial stress fracture.  Phone follow-up with those results when available and we will delineate a more  definitive treatment plan at that time.

## 2018-06-25 ENCOUNTER — Ambulatory Visit
Admission: RE | Admit: 2018-06-25 | Discharge: 2018-06-25 | Disposition: A | Discharge status: Home / Self Care | Payer: No Typology Code available for payment source | Source: Ambulatory Visit | Attending: Sports Medicine | Admitting: Sports Medicine

## 2018-06-25 DIAGNOSIS — M25562 Pain in left knee: Secondary | ICD-10-CM

## 2018-07-02 ENCOUNTER — Telehealth: Payer: Self-pay | Admitting: Sports Medicine

## 2018-07-02 DIAGNOSIS — M25562 Pain in left knee: Secondary | ICD-10-CM

## 2018-07-02 NOTE — Telephone Encounter (Signed)
  Patient stopped by the office yesterday at my request to review MRI findings of her left knee.  She has a horizontal tear in the periphery of the posterior horn of the medial meniscus which extends to the undersurface.  I had a long discussion with her regarding her diagnosis and treatment going forward.  While this is certainly not emergent, I do not believe she will be able to return to her previous level of activity without surgical intervention.  Other than some mild fissuring of the hyaline cartilage along the medial patellar facet, the remainder of her knee is quite healthy.  I recommended surgical consultation with Dr. Rhona Raider to discuss further treatment.  I will defer further work-up and treatment to the discretion of Dr. Rhona Raider and the patient will follow-up with me as needed.

## 2018-07-02 NOTE — Addendum Note (Signed)
Addended by: Jolinda Croak E on: 07/02/2018 02:22 PM   Modules accepted: Orders

## 2018-09-09 ENCOUNTER — Other Ambulatory Visit: Payer: Self-pay | Admitting: Orthopaedic Surgery

## 2018-09-15 ENCOUNTER — Encounter (HOSPITAL_BASED_OUTPATIENT_CLINIC_OR_DEPARTMENT_OTHER): Admission: RE | Payer: Self-pay | Source: Home / Self Care

## 2018-09-15 ENCOUNTER — Ambulatory Visit (HOSPITAL_BASED_OUTPATIENT_CLINIC_OR_DEPARTMENT_OTHER)
Admission: RE | Admit: 2018-09-15 | Payer: No Typology Code available for payment source | Source: Home / Self Care | Admitting: Orthopaedic Surgery

## 2018-09-15 SURGERY — ARTHROSCOPY, KNEE
Anesthesia: Choice | Laterality: Left

## 2019-03-03 LAB — OB RESULTS CONSOLE HIV ANTIBODY (ROUTINE TESTING): HIV: NONREACTIVE

## 2019-03-03 LAB — OB RESULTS CONSOLE ABO/RH: RH Type: POSITIVE

## 2019-03-03 LAB — OB RESULTS CONSOLE RPR: RPR: NONREACTIVE

## 2019-03-03 LAB — OB RESULTS CONSOLE GBS: GBS: POSITIVE

## 2019-03-03 LAB — OB RESULTS CONSOLE GC/CHLAMYDIA
Chlamydia: NEGATIVE
Gonorrhea: NEGATIVE

## 2019-03-03 LAB — OB RESULTS CONSOLE RUBELLA ANTIBODY, IGM: Rubella: IMMUNE

## 2019-03-03 LAB — OB RESULTS CONSOLE ANTIBODY SCREEN: Antibody Screen: NEGATIVE

## 2019-03-03 LAB — OB RESULTS CONSOLE HEPATITIS B SURFACE ANTIGEN: Hepatitis B Surface Ag: NEGATIVE

## 2019-05-07 NOTE — L&D Delivery Note (Signed)
Delivery Note At 4:33 PM a viable female was delivered via Vaginal, Spontaneous (Presentation: Left Occiput Anterior).  APGAR: 5, 9; weight 8 lb 4.8 oz (3765 g).   Placenta status: Spontaneous, Intact.  Cord: 3 vessels with the following complications: None.  Cord pH: unable to obtain  Anesthesia: Local Episiotomy: None Lacerations: 1st degree;Perineal Suture Repair: 3.0 vicryl Est. Blood Loss (mL): 300  Mom to postpartum.  Baby to Couplet care / Skin to Skin.  [name] 10/14/2019, 10:37 PM

## 2019-07-22 ENCOUNTER — Ambulatory Visit: Payer: Self-pay | Attending: Internal Medicine

## 2019-07-22 DIAGNOSIS — Z23 Encounter for immunization: Secondary | ICD-10-CM

## 2019-07-22 NOTE — Progress Notes (Signed)
   Covid-19 Vaccination Clinic  Name:  Natalie Jensen    MRN: [id number] DOB: July 15, 1980  07/22/2019  Natalie Jensen was observed post Covid-19 immunization for 15 minutes without incident. She was provided with Vaccine Information Sheet and instruction to access the V-Safe system.   Natalie Jensen was instructed to call 911 with any severe reactions post vaccine: Marland Kitchen Difficulty breathing  . Swelling of face and throat  . A fast heartbeat  . A bad rash all over body  . Dizziness and weakness   Immunizations Administered    Name Date Dose VIS Date Route   Pfizer COVID-19 Vaccine 07/22/2019  4:55 PM 0.3 mL 04/16/2019 Intramuscular   Manufacturer: [place]   Lot: [id number]   [place]: [id number]

## 2019-08-17 ENCOUNTER — Ambulatory Visit: Payer: Self-pay | Attending: Internal Medicine

## 2019-08-17 DIAGNOSIS — Z23 Encounter for immunization: Secondary | ICD-10-CM

## 2019-08-17 NOTE — Progress Notes (Signed)
   Covid-19 Vaccination Clinic  Name:  Natalie Jensen    MRN: [id number] DOB: 1981-01-04  08/17/2019  Ms. Roda was observed post Covid-19 immunization for 15 minutes without incident. She was provided with Vaccine Information Sheet and instruction to access the V-Safe system.   Ms. Swerdlow was instructed to call 911 with any severe reactions post vaccine: Marland Kitchen Difficulty breathing  . Swelling of face and throat  . A fast heartbeat  . A bad rash all over body  . Dizziness and weakness   Immunizations Administered    Name Date Dose VIS Date Route   Pfizer COVID-19 Vaccine 08/17/2019 10:53 AM 0.3 mL 04/16/2019 Intramuscular   Manufacturer: [place]   Lot: [id number]   [place]: [id number]

## 2019-09-30 ENCOUNTER — Telehealth (HOSPITAL_COMMUNITY): Payer: Self-pay | Admitting: *Deleted

## 2019-10-05 IMAGING — DX DG KNEE AP/LAT W/ SUNRISE*L*
3 series · 3 of 3 positions shown · non-contrast
Comparison: None.

CLINICAL DATA: Increasing left knee pain for 1-2 months. No known
injury.

EXAM:
LEFT KNEE 3 VIEWS

[dg knee ap/lat w/ sunrise left (1 of 3)]
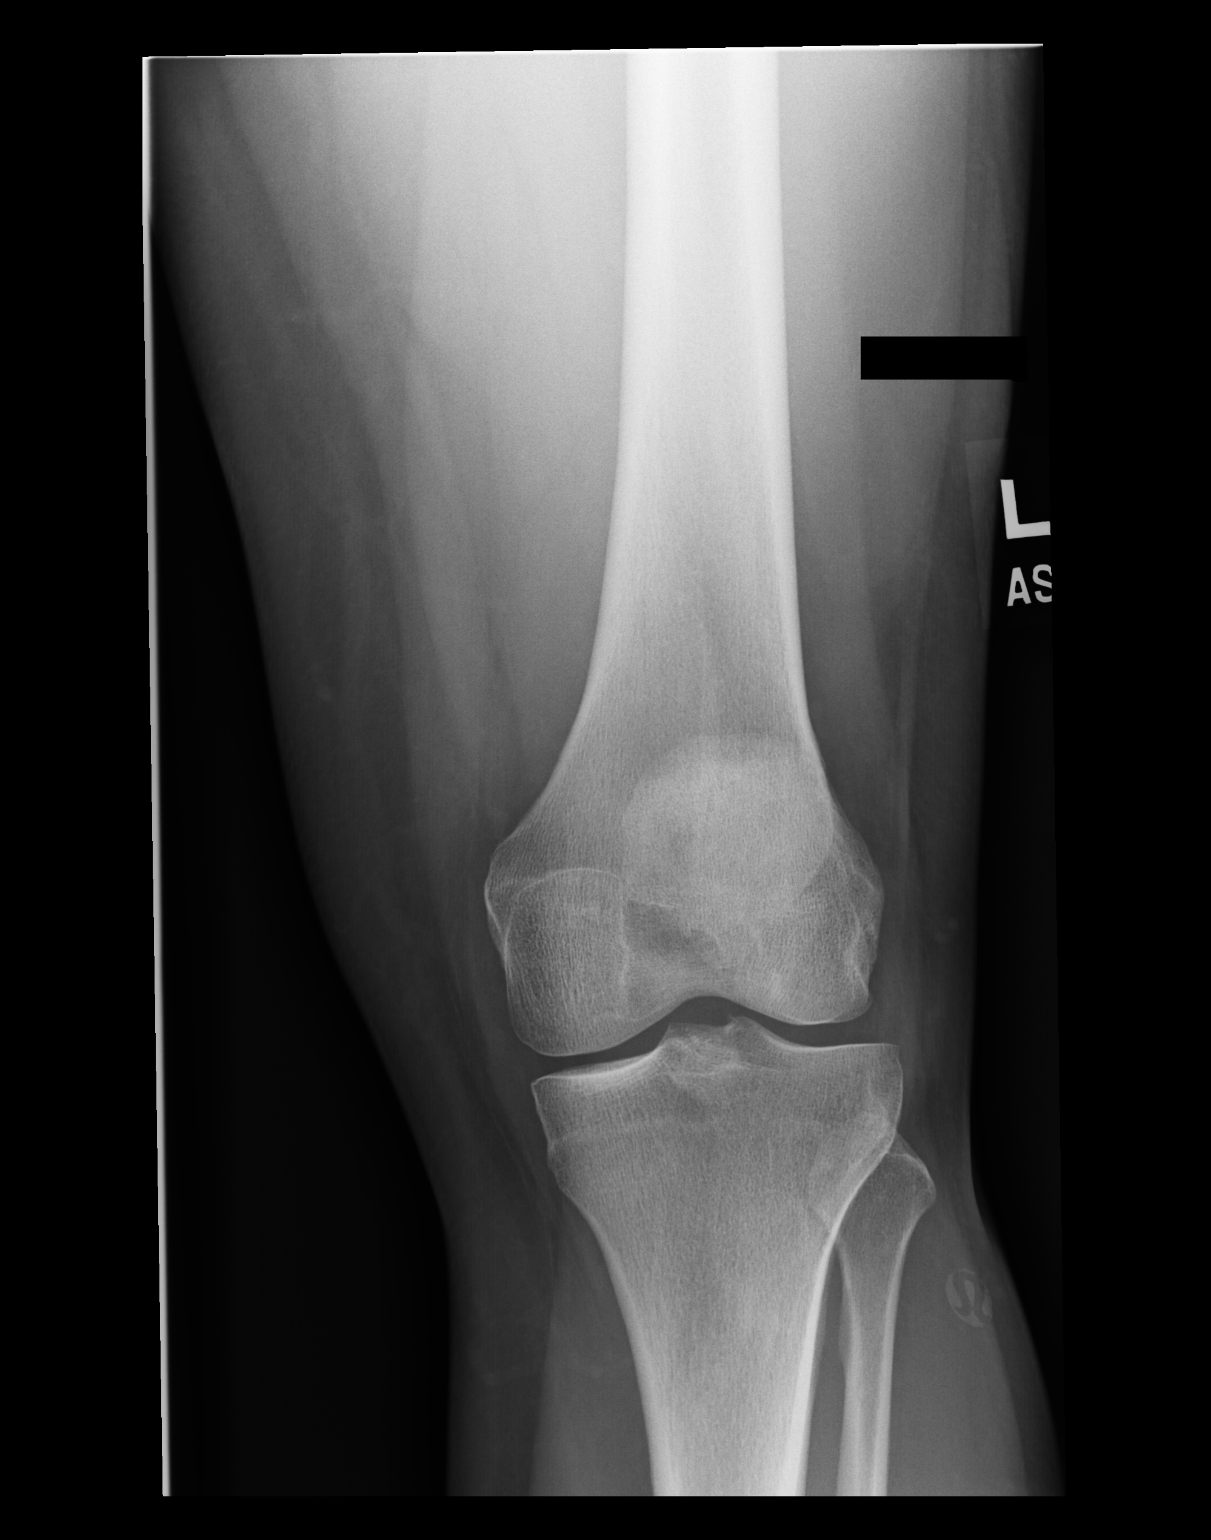

[dg knee ap/lat w/ sunrise left (2 of 3)]
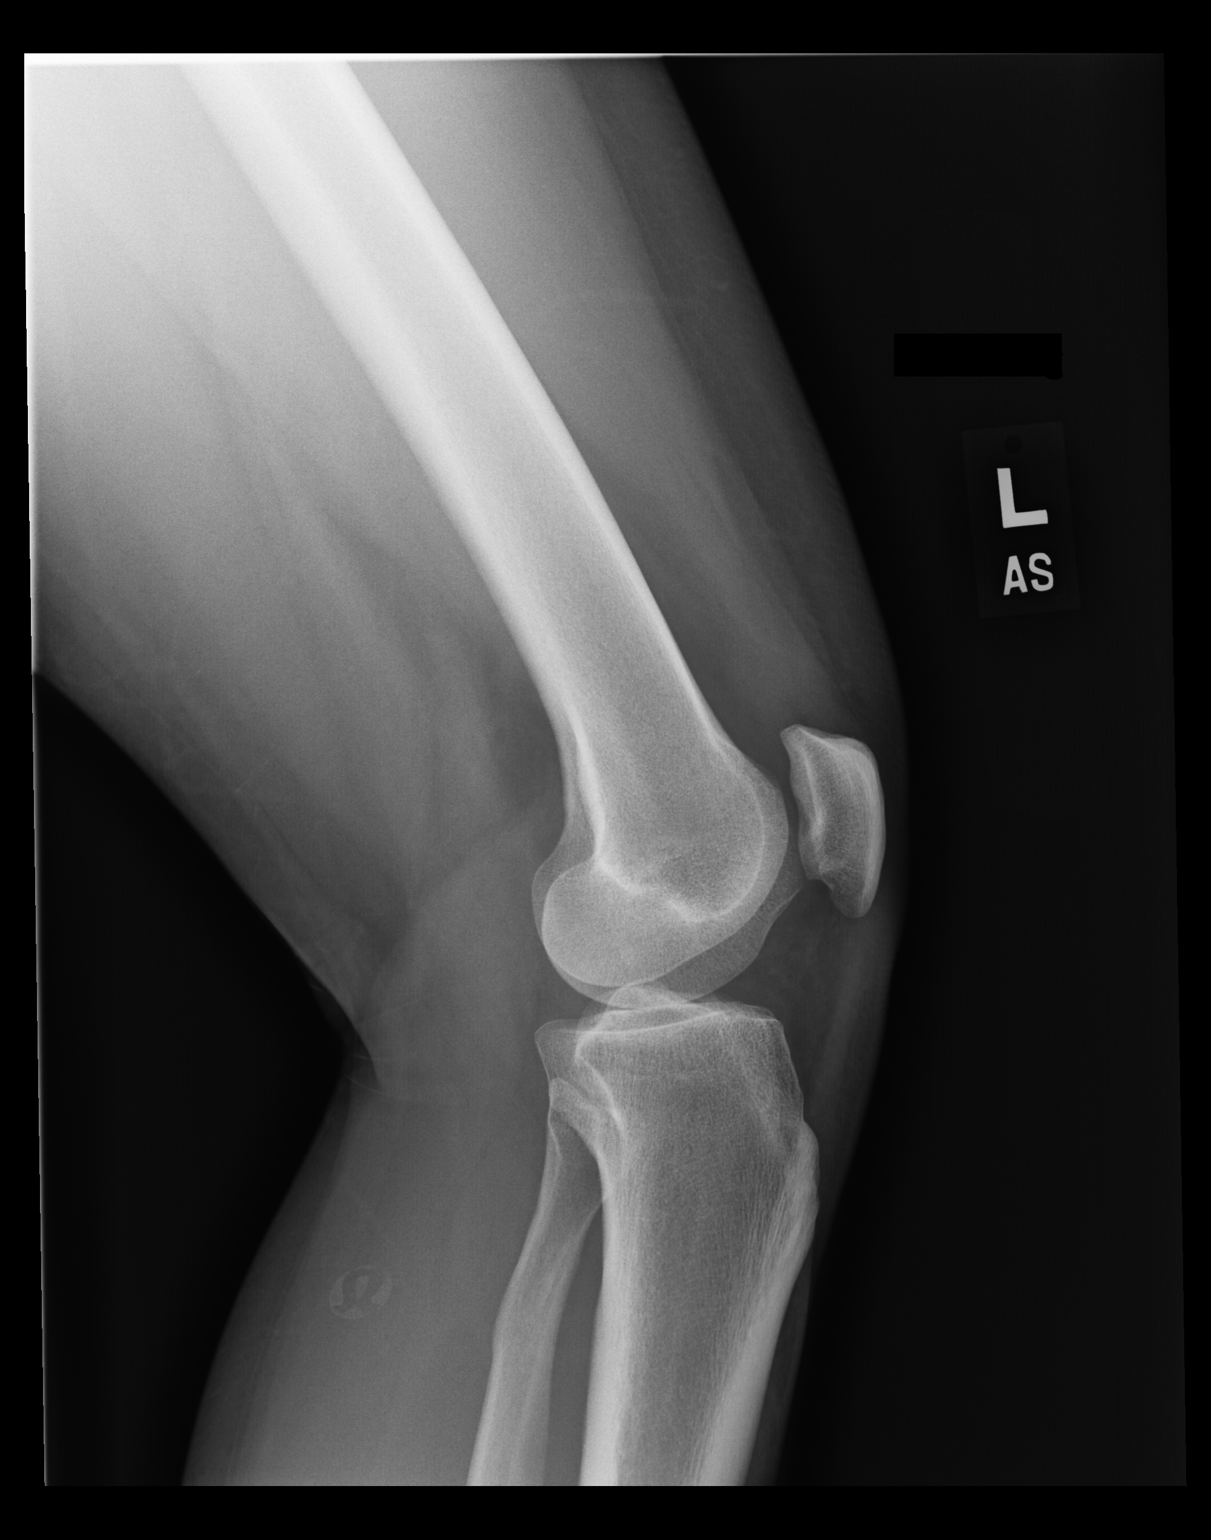

[dg knee ap/lat w/ sunrise left (3 of 3)]
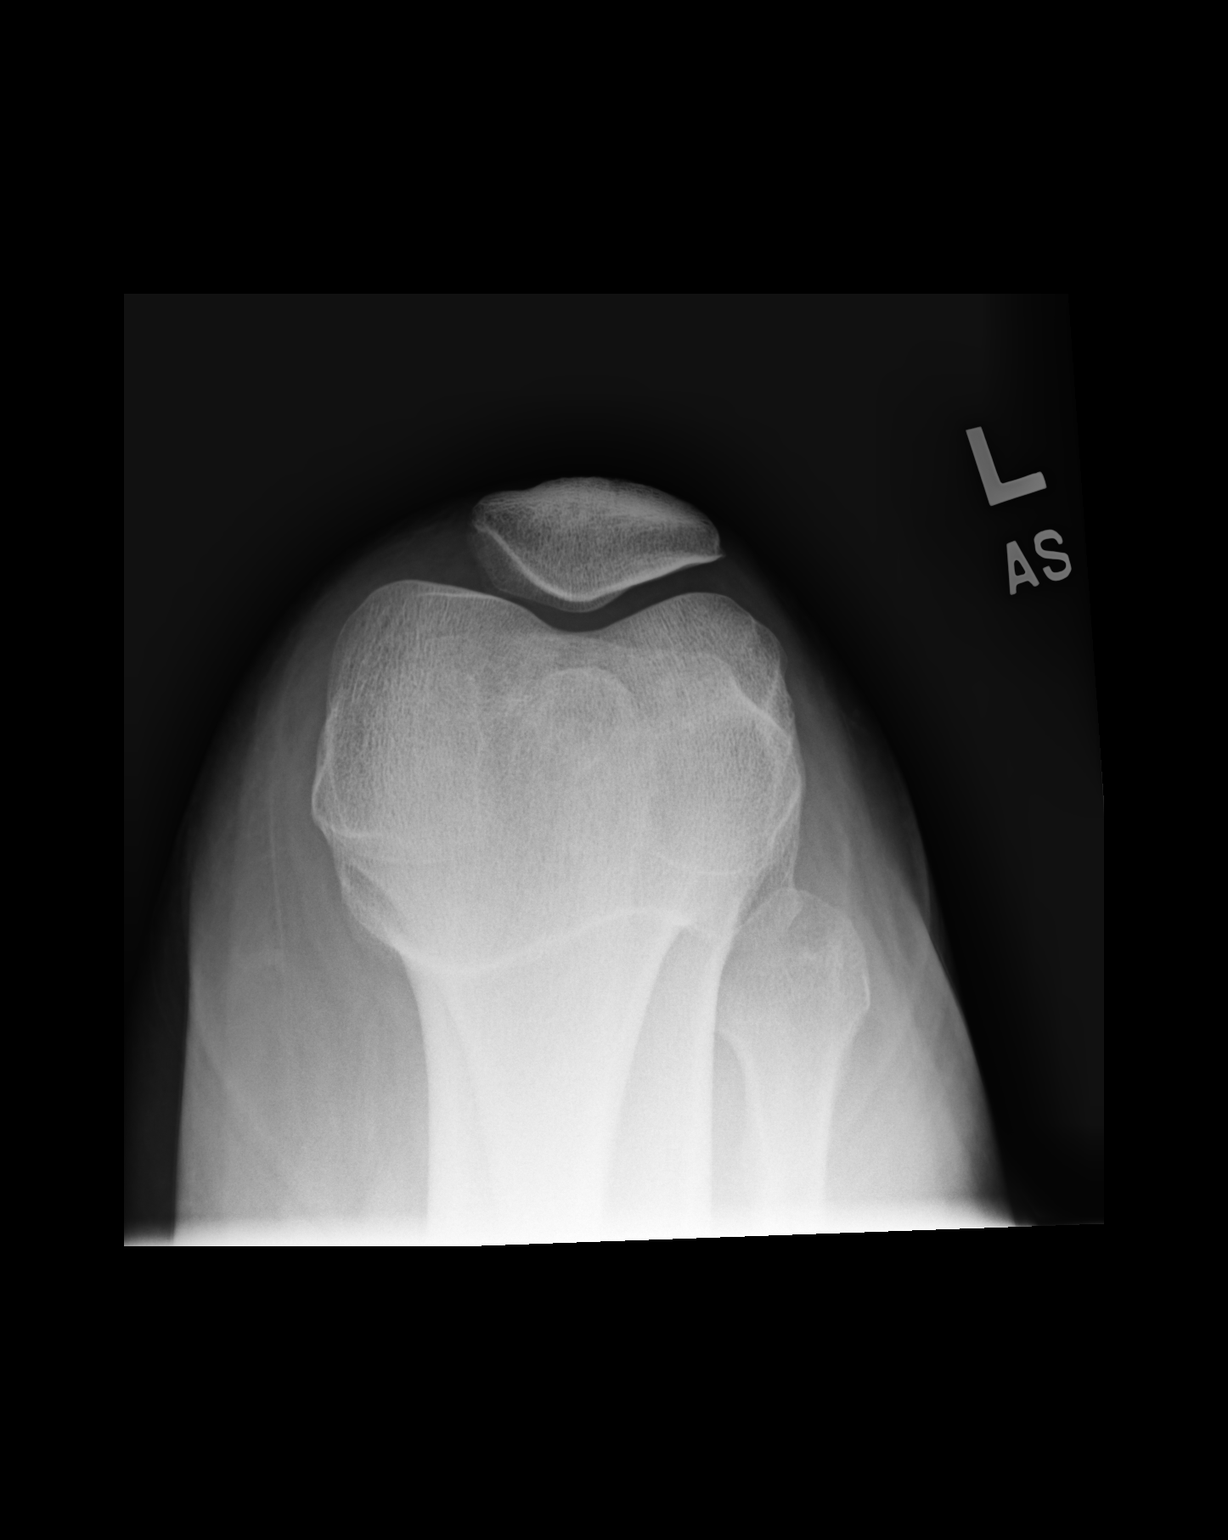

[3 of 3 positions shown; findings below may reference images not displayed]

FINDINGS: No evidence of fracture, dislocation, or joint effusion. No evidence
of arthropathy or other focal bone abnormality. Soft tissues are
unremarkable.
IMPRESSION: Negative.

## 2019-10-06 ENCOUNTER — Telehealth (HOSPITAL_COMMUNITY): Payer: Self-pay | Admitting: *Deleted

## 2019-10-06 NOTE — Telephone Encounter (Signed)
Preadmission screen  

## 2019-10-07 ENCOUNTER — Telehealth (HOSPITAL_COMMUNITY): Payer: Self-pay | Admitting: *Deleted

## 2019-10-07 NOTE — Telephone Encounter (Signed)
Preadmission screen  

## 2019-10-08 ENCOUNTER — Telehealth (HOSPITAL_COMMUNITY): Payer: Self-pay | Admitting: *Deleted

## 2019-10-08 NOTE — Telephone Encounter (Signed)
Preadmission screen  

## 2019-10-11 ENCOUNTER — Encounter (HOSPITAL_COMMUNITY): Payer: Self-pay | Admitting: *Deleted

## 2019-10-12 ENCOUNTER — Other Ambulatory Visit (HOSPITAL_COMMUNITY)
Admission: RE | Admit: 2019-10-12 | Discharge: 2019-10-12 | Disposition: A | Discharge status: Home / Self Care | Payer: No Typology Code available for payment source | Source: Ambulatory Visit | Attending: Obstetrics & Gynecology | Admitting: Obstetrics & Gynecology

## 2019-10-12 ENCOUNTER — Other Ambulatory Visit (HOSPITAL_COMMUNITY): Payer: No Typology Code available for payment source

## 2019-10-12 ENCOUNTER — Telehealth (HOSPITAL_COMMUNITY): Payer: Self-pay | Admitting: *Deleted

## 2019-10-12 ENCOUNTER — Encounter (HOSPITAL_COMMUNITY): Payer: Self-pay | Admitting: *Deleted

## 2019-10-12 DIAGNOSIS — Z01812 Encounter for preprocedural laboratory examination: Secondary | ICD-10-CM | POA: Insufficient documentation

## 2019-10-12 DIAGNOSIS — Z20822 Contact with and (suspected) exposure to covid-19: Secondary | ICD-10-CM | POA: Insufficient documentation

## 2019-10-12 LAB — SARS CORONAVIRUS 2 (TAT 6-24 HRS): SARS Coronavirus 2: NEGATIVE

## 2019-10-12 NOTE — Telephone Encounter (Signed)
Preadmission screen  

## 2019-10-13 NOTE — H&P (Signed)
HPI: 39 y/o G2P1001 @ [redacted]w[redacted]d estimated gestational age (as dated by LMP c/w 20 week ultrasound) presents for IOL.   no Leaking of Fluid,   no Vaginal Bleeding,   irregular Uterine Contractions,  + Fetal Movement.  Prenatal care has been provided by Dr. Nelda Marseille  ROS: no HA, no epigastric pain, no visual changes.    Pregnancy complicated by: 1) AMA- normal [place] and genetic testing 2) GBS bacteruria: PCN in labor   Prenatal Transfer Tool  Maternal Diabetes: No Genetic Screening: Normal Maternal Ultrasounds/Referrals: Normal Fetal Ultrasounds or other Referrals:  None Maternal Substance Abuse:  No Significant Maternal Medications:  None Significant Maternal Lab Results: Group B Strep positive   PNL:  GBS positive, Rub Immune, Hep B neg, RPR NR, HIV neg, GC/C neg, glucola:142- 3hr within normal limits Hgb: 12 Blood type: A positive, antibody neg  Immunizations: Tdap: 08/05/19 Flu: 10/28  OBHx: FTNSVD- 9#9IP uncomplicated PMHx:  PCOS Meds:  PNV Allergy:   Allergies  Allergen Reactions  . Bee Venom Hives  . Sulfa Antibiotics Hives   SurgHx: none SocHx:   no Tobacco, no  EtOH, no Illicit Drugs  O: There were no vitals taken for this visit. Gen. AAOx3, NAD CV.  RRR  No murmur.  Resp. CTAB, no wheeze or crackles. Abd. Gravid,  no tenderness,  no rigidity,  no guarding Extr.  no edema B/L , no calf tenderness, neg Homan's B/L FHT: 130 by doppler Toco:  SVE: 2/70/-2, vertex   Labs: see orders  A/P:  39 y.o. G2P1001 @ [redacted]w[redacted]d EGA who presents for IOL -FWB:  NICHD Cat I FHTs -Labor: plan for Pitocin per protocol -GBS: positive- PCN per protocol -Pain management: IV or epidural if desired  Janyth Pupa, DO [phone] (cell) [phone] (office)

## 2019-10-14 ENCOUNTER — Inpatient Hospital Stay (HOSPITAL_COMMUNITY)
Admission: AD | Admit: 2019-10-14 | Discharge: 2019-10-16 | DRG: 807 | Disposition: A | Discharge status: Home / Self Care | Payer: No Typology Code available for payment source | Attending: Obstetrics & Gynecology | Admitting: Obstetrics & Gynecology

## 2019-10-14 ENCOUNTER — Other Ambulatory Visit: Payer: Self-pay

## 2019-10-14 ENCOUNTER — Inpatient Hospital Stay (HOSPITAL_COMMUNITY): Payer: No Typology Code available for payment source

## 2019-10-14 ENCOUNTER — Encounter (HOSPITAL_COMMUNITY): Payer: Self-pay | Admitting: Obstetrics & Gynecology

## 2019-10-14 DIAGNOSIS — O99824 Streptococcus B carrier state complicating childbirth: Secondary | ICD-10-CM | POA: Diagnosis present

## 2019-10-14 DIAGNOSIS — Z3A40 40 weeks gestation of pregnancy: Secondary | ICD-10-CM | POA: Diagnosis not present

## 2019-10-14 DIAGNOSIS — Z20822 Contact with and (suspected) exposure to covid-19: Secondary | ICD-10-CM | POA: Diagnosis present

## 2019-10-14 DIAGNOSIS — O26893 Other specified pregnancy related conditions, third trimester: Secondary | ICD-10-CM | POA: Diagnosis present

## 2019-10-14 LAB — TYPE AND SCREEN
ABO/RH(D): A POS
Antibody Screen: NEGATIVE

## 2019-10-14 LAB — CBC
HCT: 37.7 % (ref 36.0–46.0)
Hemoglobin: 12.5 g/dL (ref 12.0–15.0)
MCH: 31.9 pg (ref 26.0–34.0)
MCHC: 33.2 g/dL (ref 30.0–36.0)
MCV: 96.2 fL (ref 80.0–100.0)
Platelets: 219 K/uL (ref 150–400)
RBC: 3.92 MIL/uL (ref 3.87–5.11)
RDW: 13.3 % (ref 11.5–15.5)
WBC: 7.9 K/uL (ref 4.0–10.5)
nRBC: 0 % (ref 0.0–0.2)

## 2019-10-14 LAB — ABO/RH: ABO/RH(D): A POS

## 2019-10-14 LAB — RPR: RPR Ser Ql: NONREACTIVE

## 2019-10-14 MED ORDER — SODIUM CHLORIDE 0.9 % IV SOLN
5.0000 10*6.[IU] | Freq: Once | INTRAVENOUS | Status: AC
  Administered 2019-10-14: 5 10*6.[IU] via INTRAVENOUS
  Filled 2019-10-14: qty 5

## 2019-10-14 MED ORDER — DIBUCAINE (PERIANAL) 1 % EX OINT
1.0000 "application " | TOPICAL_OINTMENT | CUTANEOUS | Status: DC | PRN
  Filled 2019-10-14: qty 28

## 2019-10-14 MED ORDER — LIDOCAINE HCL (PF) 1 % IJ SOLN: 30.0000 mL | INTRAMUSCULAR | Status: DC | PRN

## 2019-10-14 MED ORDER — LIDOCAINE HCL (PF) 1 % IJ SOLN
INTRAMUSCULAR | Status: AC
  Administered 2019-10-14: 30 mL
  Filled 2019-10-14: qty 30

## 2019-10-14 MED ORDER — OXYTOCIN-SODIUM CHLORIDE 30-0.9 UT/500ML-% IV SOLN
1.0000 m[IU]/min | INTRAVENOUS | Status: DC
  Administered 2019-10-14: 2 m[IU]/min via INTRAVENOUS
  Filled 2019-10-14: qty 500

## 2019-10-14 MED ORDER — SIMETHICONE 80 MG PO CHEW: 80.0000 mg | CHEWABLE_TABLET | ORAL | Status: DC | PRN

## 2019-10-14 MED ORDER — ZOLPIDEM TARTRATE 5 MG PO TABS: 5.0000 mg | ORAL_TABLET | Freq: Every evening | ORAL | Status: DC | PRN

## 2019-10-14 MED ORDER — SOD CITRATE-CITRIC ACID 500-334 MG/5ML PO SOLN: 30.0000 mL | ORAL | Status: DC | PRN

## 2019-10-14 MED ORDER — TERBUTALINE SULFATE 1 MG/ML IJ SOLN: 0.2500 mg | Freq: Once | INTRAMUSCULAR | Status: DC | PRN

## 2019-10-14 MED ORDER — OXYTOCIN BOLUS FROM INFUSION
500.0000 mL | Freq: Once | INTRAVENOUS | Status: AC
  Administered 2019-10-14: 500 mL via INTRAVENOUS

## 2019-10-14 MED ORDER — ONDANSETRON HCL 4 MG PO TABS: 4.0000 mg | ORAL_TABLET | ORAL | Status: DC | PRN

## 2019-10-14 MED ORDER — ONDANSETRON HCL 4 MG/2ML IJ SOLN: 4.0000 mg | INTRAMUSCULAR | Status: DC | PRN

## 2019-10-14 MED ORDER — COCONUT OIL OIL: 1.0000 "application " | TOPICAL_OIL | Status: DC | PRN

## 2019-10-14 MED ORDER — BENZOCAINE-MENTHOL 20-0.5 % EX AERO
1.0000 "application " | INHALATION_SPRAY | CUTANEOUS | Status: DC | PRN
  Administered 2019-10-14: 1 via TOPICAL
  Filled 2019-10-14 (×2): qty 56

## 2019-10-14 MED ORDER — WITCH HAZEL-GLYCERIN EX PADS: 1.0000 "application " | MEDICATED_PAD | CUTANEOUS | Status: DC | PRN

## 2019-10-14 MED ORDER — PRENATAL MULTIVITAMIN CH
1.0000 | ORAL_TABLET | Freq: Every day | ORAL | Status: DC
  Administered 2019-10-15: 1 via ORAL
  Filled 2019-10-14: qty 1

## 2019-10-14 MED ORDER — DIPHENHYDRAMINE HCL 25 MG PO CAPS: 25.0000 mg | ORAL_CAPSULE | Freq: Four times a day (QID) | ORAL | Status: DC | PRN

## 2019-10-14 MED ORDER — ACETAMINOPHEN 325 MG PO TABS: 650.0000 mg | ORAL_TABLET | ORAL | Status: DC | PRN

## 2019-10-14 MED ORDER — FENTANYL CITRATE (PF) 100 MCG/2ML IJ SOLN: 50.0000 ug | INTRAMUSCULAR | Status: DC | PRN

## 2019-10-14 MED ORDER — OXYCODONE-ACETAMINOPHEN 5-325 MG PO TABS: 2.0000 | ORAL_TABLET | ORAL | Status: DC | PRN

## 2019-10-14 MED ORDER — OXYTOCIN-SODIUM CHLORIDE 30-0.9 UT/500ML-% IV SOLN
2.5000 [IU]/h | INTRAVENOUS | Status: DC
  Administered 2019-10-14: 2.5 [IU]/h via INTRAVENOUS
  Filled 2019-10-14: qty 500

## 2019-10-14 MED ORDER — PENICILLIN G POT IN DEXTROSE 60000 UNIT/ML IV SOLN
3.0000 10*6.[IU] | INTRAVENOUS | Status: DC
  Administered 2019-10-14 (×2): 3 10*6.[IU] via INTRAVENOUS
  Filled 2019-10-14 (×2): qty 50

## 2019-10-14 MED ORDER — OXYCODONE-ACETAMINOPHEN 5-325 MG PO TABS: 1.0000 | ORAL_TABLET | ORAL | Status: DC | PRN

## 2019-10-14 MED ORDER — LACTATED RINGERS IV SOLN: INTRAVENOUS | Status: DC

## 2019-10-14 MED ORDER — SENNOSIDES-DOCUSATE SODIUM 8.6-50 MG PO TABS
2.0000 | ORAL_TABLET | ORAL | Status: DC
  Administered 2019-10-15 (×2): 2 via ORAL
  Filled 2019-10-14 (×2): qty 2

## 2019-10-14 MED ORDER — IBUPROFEN 600 MG PO TABS
600.0000 mg | ORAL_TABLET | Freq: Four times a day (QID) | ORAL | Status: DC
  Administered 2019-10-14 – 2019-10-16 (×7): 600 mg via ORAL
  Filled 2019-10-14 (×7): qty 1

## 2019-10-14 MED ORDER — LACTATED RINGERS IV SOLN: 500.0000 mL | INTRAVENOUS | Status: DC | PRN

## 2019-10-14 MED ORDER — ACETAMINOPHEN 325 MG PO TABS
650.0000 mg | ORAL_TABLET | ORAL | Status: DC | PRN
  Administered 2019-10-15: 650 mg via ORAL
  Filled 2019-10-14: qty 2

## 2019-10-14 MED ORDER — ONDANSETRON HCL 4 MG/2ML IJ SOLN: 4.0000 mg | Freq: Four times a day (QID) | INTRAMUSCULAR | Status: DC | PRN

## 2019-10-14 NOTE — Progress Notes (Signed)
OB PN:  S: Pt feeling mild contractions- 3/10  O: BP 119/74   Pulse 77   Temp 98.6 F (37 C) (Oral)   Resp 18   Ht 5\' 4"  (1.626 m)   Wt 80.2 kg   SpO2 100%   BMI 30.36 kg/m   FHT: 130 bpm, moderate variablity, + accels, no decels Toco: q3-86min SVE: deferred- 3/80/-2 per RN  A/P: 39 y.o. G2P1001 @ [redacted]w[redacted]d for IOL 1. FWB: Cat. I 2. Labor: continue Pit per protocol Pain: IV or epidural if desired GBS: positive, continue PCN per protocol  Janyth Pupa, DO [phone] (cell) [phone] (office)

## 2019-10-14 NOTE — Progress Notes (Signed)
OB PN:  S: Feeling more painful contractions  O: BP (!) 103/54    Pulse 68    Temp 98.6 F (37 C) (Oral)    Resp 18    Ht 5\' 4"  (1.626 m)    Wt 80.2 kg    SpO2 100%    BMI 30.36 kg/m   FHT: 130 bpm, moderate variablity, + accels, no decels Toco: q15min SVE: 8-9/100/0  A/P: 39 y.o. G2P1001 @ [redacted]w[redacted]d for IOL 1. FWB: Cat. I 2. Labor: continue Pit per protocol Pain: IV or epidural if desired GBS: positive, continue PCN per protocol  Janyth Pupa, DO [phone] (cell) [phone] (office)

## 2019-10-15 LAB — CBC
HCT: 35.3 % — ABNORMAL LOW (ref 36.0–46.0)
Hemoglobin: 11.6 g/dL — ABNORMAL LOW (ref 12.0–15.0)
MCH: 31.7 pg (ref 26.0–34.0)
MCHC: 32.9 g/dL (ref 30.0–36.0)
MCV: 96.4 fL (ref 80.0–100.0)
Platelets: 189 10*3/uL (ref 150–400)
RBC: 3.66 MIL/uL — ABNORMAL LOW (ref 3.87–5.11)
RDW: 13.2 % (ref 11.5–15.5)
WBC: 9.2 10*3/uL (ref 4.0–10.5)
nRBC: 0 % (ref 0.0–0.2)

## 2019-10-15 NOTE — Lactation Note (Signed)
This note was copied from a baby's chart. Lactation Consultation Note  Patient Name: Natalie Jensen Date: 10/15/2019  Telelphone call from RN mom breastfeeding.  Mom breastfeeding in cross cradle hold.  Infant latched shallowly.  Attempt to reposition infant with mom more laid back.  Mom reports a little more comfort.  But still feels a little uncomfortable.  Infant seems to get a deeper latch when mom lays back. However infant fell asleep and when mom took her off nipple was slightly compressed.  Mom reports nipple compression happening mostly on the left.  Moms nipple is slightly smaller and does not evert as much as on the right.Mom reprots went to see [name] at outpatient who referred her to Dr [name].  Mom reports first child was tongue tied and went to see Dr. [name] in [place]. Mom reports clipped infants tongue and they breastfed well until 14 months.    Infant, baby Natalie Phobe, started cuing.  Discussed trying football with mom.  Let mom latch her and she was a shallow.  Assisted mom in flanging lips and putting rolled up towel; under hand to try and keep her in closer. Unable to see infants tongue coming over gumline at this time.  Mom reports still a little uncomfortable. Left to get mom comfort gels and a manual pump to help that nipple evert.  Mom had infant off when came back and dad was changing poopy diaper.  Reviewed how to use comfort gels.  Gave mom resource list for tongue tie.  Urged her to follow up with Dr [name] again since he had seen her first child.  Mom reports friends baby was tongue tied and had it clipped here.  Urged parents to talk to pediatrician in am.  Let parents know lactation would also follow up with them this pm. Mom asked for a nipple shield.  Gave mom 24 mm nipple shield.  Explained if she started nipple shield use she would need to start pumping and that it may not be a bad idea to go ahead and intitiate pumping.    Maternal Data     Feeding Feeding Type: Breast Fed  [facility] Score                   Interventions    Lactation Tools Discussed/Used     Consult Status      Si Jachim Thompson Caul 10/15/2019, 7:47 PM

## 2019-10-15 NOTE — Progress Notes (Signed)
Postpartum Note Day # 1  S:  Patient resting comfortable in bed.  Pain controlled.  Tolerating general diet. + flatus, no BM.  Lochia moderate.  Ambulating without difficulty.  She denies n/v/f/c, SOB, or CP.  Pt plans on breastfeeding.  O: Temp:  [97.8 F (36.6 C)-98.8 F (37.1 C)] 98.4 F (36.9 C) (06/11 0454) Pulse Rate:  [62-105] 63 (06/11 0454) Resp:  [18-20] 19 (06/11 0454) BP: (101-129)/(54-85) 107/67 (06/11 0454) SpO2:  [97 %-100 %] 100 % (06/11 0454) Weight:  [80.2 kg] 80.2 kg (06/10 0811)   Gen: A&Ox3, NAD CV: RRR, no MRG Resp: CTAB Abdomen: soft, NT, ND Uterus: firm, non-tender, below umbilicus Ext: No edema, no calf tenderness bilaterally, SCDs in place  Labs:  Recent Labs    10/14/19 0810 10/15/19 0534  HGB 12.5 11.6*    A/P: Pt is a 39 y.o. [id number] s/p NSVD, PPD#1  - Pain well controlled -GU: voiding freely -GI: Tolerating general diet -Activity: encouraged sitting up to chair and ambulation as tolerated -Prophylaxis: early ambulation -Labs: stable as above  DISPO: Continue with routine postpartum care  Janyth Pupa, DO [phone] (cell) [phone] (office)

## 2019-10-15 NOTE — Lactation Note (Signed)
This note was copied from a baby's chart. Lactation Consultation Note  Patient Name: Natalie Jensen [id number] Date: 10/15/2019   Mom is a g2P2.  Mom with hx of IVF,. PCOS. Mom sleeping on arrival.  Did not wake up.  Introduced myself to dad.  Urged them to call when mom wakes up.  Let RN know.   Maternal Data    Feeding Feeding Type: Breast Fed  LATCH Score Latch: Grasps breast easily, tongue down, lips flanged, rhythmical sucking.  Audible Swallowing: A few with stimulation  Type of Nipple: Everted at rest and after stimulation  Comfort (Breast/Nipple): Filling, red/small blisters or bruises, mild/mod discomfort  Hold (Positioning): Assistance needed to correctly position infant at breast and maintain latch.  LATCH Score: 7  Interventions    Lactation Tools Discussed/Used     Consult Status      Gerlean Cid Thompson Caul 10/15/2019, 2:20 PM

## 2019-10-16 MED ORDER — IBUPROFEN 600 MG PO TABS: 600.0000 mg | ORAL_TABLET | Freq: Four times a day (QID) | ORAL | 0 refills | Status: AC

## 2019-10-16 NOTE — Lactation Note (Signed)
This note was copied from a baby's chart. Lactation Consultation Note Baby 60 hrs old. Mom wanting to go home today. Mom using NS d/t painful latch. Mom stated it feels much better. Mom has good everted nipples.  Mom went through this w/her sone now 63 months old. He had tight frenulum and had clipped. Mom will take her daughter for assessment. Mom feels that baby has one as well. Looks like baby might have mid posterior or mid anterior.  Noted colostrum in NS. Mom used hand pump collected 5 ml colostrum. Gave to baby in foley cup.  Newborn feeding habits, breast massage, milk storage, engorgement, feeding positions, support, supply and demand, and supplementing w/colostrum. Answered questions. Mom is going to call Lactation OP services to make F/U appt.  Baldwin worked w/mom on positioning before left room.   Patient Name: Natalie Jensen [id number] Date: 10/16/2019 Reason for consult: Follow-up assessment;Term   Maternal Data    Feeding Feeding Type: Breast Milk  LATCH Score Latch: Grasps breast easily, tongue down, lips flanged, rhythmical sucking.  Audible Swallowing: A few with stimulation  Type of Nipple: Everted at rest and after stimulation  Comfort (Breast/Nipple): Soft / non-tender (tender)  Hold (Positioning): Assistance needed to correctly position infant at breast and maintain latch.  LATCH Score: 8  Interventions Interventions: Breast feeding basics reviewed;Support pillows;Assisted with latch;Position options;Expressed milk;Breast massage;Hand express;Breast compression;Adjust position  Lactation Tools Discussed/Used     Consult Status Consult Status: Complete Date: 10/16/19    Theodoro Kalata 10/16/2019, 4:50 AM

## 2019-10-16 NOTE — Discharge Summary (Signed)
SVD OB Discharge Summary     Patient Name: Natalie Jensen DOB: 1980/11/23 MRN: [id number]  Date of admission: 10/14/2019 Delivering MD: Janyth Pupa  Date of delivery: 10/14/2019 Type of delivery: SVD  Newborn Data: Sex: Baby female Live born female  Birth Weight: 8 lb 4.8 oz (3765 g) APGAR: 5, 9  Newborn Delivery   Time head delivered: 10/14/2019 16:32:00 Birth date/time: 10/14/2019 16:33:00 Delivery type: Vaginal, Spontaneous      Feeding: breast Infant being discharge to home with mother in stable condition.   Admitting diagnosis: Labor and delivery, indication for care [O75.9] Intrauterine pregnancy: [redacted]w[redacted]d     Secondary diagnosis:  Active Problems:   Labor and delivery, indication for care   SVD (spontaneous vaginal delivery)   Normal postpartum course                                Complications: None                                                              Intrapartum Procedures: spontaneous vaginal delivery and GBS prophylaxis Postpartum Procedures: none Complications-Operative and Postpartum: 1st degree perineal laceration Augmentation: Pitocin   History of Present Illness: Natalie Jensen is a 39 y.o. female, G2P2002, who presents at [redacted]w[redacted]d weeks gestation. The patient has been followed at  [facility]  Her pregnancy has been complicated by:  Patient Active Problem List   Diagnosis Date Noted  . SVD (spontaneous vaginal delivery) 10/16/2019  . Normal postpartum course 10/16/2019  . Labor and delivery, indication for care 10/14/2019  . 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 Normal intrauterine pregnancy in third trimester 01/22/2018    Hospital course:  Induction of Labor With Vaginal Delivery   39 y.o. yo G2P2002 at [redacted]w[redacted]d was admitted to the hospital 10/14/2019 for induction of labor.  Indication for induction: AMA and Elective.  Patient had an uncomplicated labor course as  follows: Membrane Rupture Time/Date: 3:02 PM ,10/14/2019   Delivery Method:Vaginal, Spontaneous  Episiotomy: None  Lacerations:  1st degree;Perineal  Details of delivery can be found in separate delivery note.  Patient had a routine postpartum course. Patient is discharged home 10/16/19.  Newborn Data: Birth date:10/14/2019  Birth time:4:33 PM  Gender:Female  Living status:Living  Apgars:5 ,9  Weight:3765 g  Postpartum Day # 2 : S/P NSVD due to elective with AMA. Patient up ad lib, denies syncope or dizziness. Reports consuming regular diet without issues and denies N/V. Patient reports 0 bowel movement + passing flatus.  Denies issues with urination and reports bleeding is "lighter."  Patient is breastfeeding and reports going well.  Desires undecided for postpartum contraception.  Pain is being appropriately managed with use of po meds.   Physical exam  Vitals:   10/15/19 0845 10/15/19 1546 10/15/19 2355 10/16/19 0556  BP: 90/71 111/69 102/68 109/80  Pulse: 77 71 63 70  Resp: 18 20 19 18   Temp: 98.6 F (37 C) 98.3 F (36.8 C)  98.2 F (36.8 C)  TempSrc: Oral Oral  Oral  SpO2:  99% 100%   Weight:      Height:       General:  alert, cooperative and no distress Lochia: appropriate Uterine Fundus: firm Perineum: Approximate.  DVT Evaluation: No evidence of DVT seen on physical exam. Negative Homan's sign. No cords or calf tenderness. No significant calf/ankle edema.  Labs: Lab Results  Component Value Date   WBC 9.2 10/15/2019   HGB 11.6 (L) 10/15/2019   HCT 35.3 (L) 10/15/2019   MCV 96.4 10/15/2019   PLT 189 10/15/2019   No flowsheet data found.  Date of discharge: 10/16/2019 Discharge Diagnoses: Term Pregnancy-delivered Discharge instruction: per After Visit Summary and "Baby and Me Booklet".  After visit meds:   Activity:           unrestricted and pelvic rest Advance as tolerated. Pelvic rest for 6 weeks.  Diet:                routine Medications: PNV and  Ibuprofen Postpartum contraception: Undecided Condition:  Pt discharge to home with baby in stable  Meds: Allergies as of 10/16/2019      Reactions   Bee Venom Hives   Sulfa Antibiotics Hives      Medication List    TAKE these medications   ibuprofen 600 MG tablet Commonly known as: ADVIL Take 1 tablet (600 mg total) by mouth every 6 (six) hours.   prenatal multivitamin Tabs tablet Take 1 tablet by mouth daily at 12 noon.       Discharge Follow Up:   Follow-up Information    Gyne[facility], [place] Follow up in 3 week(s).   Specialty: Obstetrics and Gynecology Why: 3 weeks and 6 weeks PP Contact information: [address] [place] [address] [phone]                [facility], NP-C, CNM 10/16/2019, 11:37 AM  Noralyn Pick, [place]

## 2019-10-18 LAB — SURGICAL PATHOLOGY

## 2019-12-29 ENCOUNTER — Other Ambulatory Visit: Payer: Self-pay

## 2019-12-29 ENCOUNTER — Ambulatory Visit: Payer: No Typology Code available for payment source | Attending: Obstetrics & Gynecology | Admitting: Physical Therapy

## 2019-12-29 ENCOUNTER — Encounter: Payer: Self-pay | Admitting: Physical Therapy

## 2019-12-29 DIAGNOSIS — R278 Other lack of coordination: Secondary | ICD-10-CM | POA: Diagnosis present

## 2019-12-29 DIAGNOSIS — M6281 Muscle weakness (generalized): Secondary | ICD-10-CM | POA: Diagnosis not present

## 2019-12-29 NOTE — Patient Instructions (Signed)
Access Code: [email or web address] URL: [email or web address] Date: 12/29/2019 Prepared by: Earlie Counts  Exercises Supine Pelvic Floor Contraction - 3 x daily - 7 x weekly - 1 sets - 10 reps - 5 sec hold [facility] [address], [place], [place] [address] Phone # [phone] Fax [phone]

## 2019-12-29 NOTE — Therapy (Signed)
[facility] [address] [place], [place], [address] Phone: [phone]   Fax:  [phone]  Physical Therapy Evaluation  Patient Details  Name: Natalie Jensen MRN: [id number] Date of Birth: [birth year] Referring Provider (PT): Dr. Janyth Pupa   Encounter Date: 12/29/2019   PT End of Session - 12/29/19 1302    Visit Number 1    Date for PT Re-Evaluation 03/22/20    Authorization Type UHC    PT Start Time 1230    PT Stop Time 9767    PT Time Calculation (min) 43 min    Activity Tolerance Patient tolerated treatment well    Behavior During Therapy [facility] for tasks assessed/performed           Past Medical History:  Diagnosis Date  . Medical history non-contributory     Past Surgical History:  Procedure Laterality Date  . ivf      There were no vitals filed for this visit.    Subjective Assessment - 12/29/19 1236    Subjective Patient had a vaginal birth on 10/14/2019. Patient had tear 1. Patient has not run for 1 year. Tried to run and the pelvic floor felt weak.    Patient Stated Goals Determine what my weakness is and how to improve it.    Currently in Pain? No/denies              [facility] PT Assessment - 12/29/19 0001      Assessment   Medical Diagnosis M62.81 generalized muscle weakness; R10.2 perineal pain    Referring Provider (PT) Dr. Janyth Pupa    Onset Date/Surgical Date 10/14/19    Prior Therapy none      Precautions   Precautions None      Restrictions   Weight Bearing Restrictions No      Balance Screen   Has the patient fallen in the past 6 months No    Has the patient had a decrease in activity level because of a fear of falling?  No    Is the patient reluctant to leave their home because of a fear of falling?  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; Physiological scientist      Cognition   Overall Cognitive Status Within Functional Limits for tasks assessed      Posture/Postural Control   Posture/Postural Control No significant limitations      ROM / Strength   AROM / PROM / Strength AROM;PROM;Strength      AROM   Lumbar Extension decreased by 25%    Lumbar - Left Side Bend decreased by 25%    Lumbar - Right Rotation decreased by 25%      Strength   Right Hip Flexion 4/5    Right Hip ABduction 4/5    Right Hip ADduction 4/5    Left Hip Flexion 4/5    Left Hip ABduction 4/5    Left Hip ADduction 4/5      Palpation   SI assessment  right ilium posteriorly rotated                      Objective measurements completed on examination: See above findings.     Pelvic Floor Special Questions - 12/29/19 0001    Diastasis  Recti 1 finger diastasis recti    Urinary Leakage No    Urinary urgency No    Fecal incontinence No    Skin Integrity Intact   dry   Perineal Body/Introitus  Descended   firm   Prolapse Anterior Wall   just a little   Pelvic Floor Internal Exam Patient confirms identification and approves PT to assess pelvic floor and treatment    Exam Type Vaginal    Palpation tightness in the bulbocavernosus and urethra sphincter right more than left    Strength weak squeeze, no lift   end of tactile cues increased to circular contraction   Strength # of seconds 5            [facility] Adult PT Treatment/Exercise - 12/29/19 0001      Neuro Re-ed    Neuro Re-ed Details  pelvic floor contraction with facilitation to the lateral side of the canal                   PT Education - 12/29/19 1351    Education Details Access Code: [email or web address]    Person(s) Educated Patient    Methods Explanation;Demonstration;Handout    Comprehension Verbalized understanding;Returned demonstration            PT Short Term Goals - 12/29/19 1526      PT SHORT TERM GOAL #1   Title  independent with initial HEP    Time 4    Period Weeks    Status New    Target Date 01/26/20      PT SHORT TERM GOAL #2   Title able to engage her TA correctly to prevent bulge of the abdomen with abdominal exercise    Time 4    Period Weeks    Status New    Target Date 01/26/20             PT Long Term Goals - 12/29/19 1529      PT LONG TERM GOAL #1   Title independent with advanced HEP    Time 12    Period Weeks    Status New    Target Date 03/22/20      PT LONG TERM GOAL #2   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 to increased in core strength and pelvic floor strength >/= 4/5    Time 12    Period Weeks    Status New    Target Date 03/22/20      PT LONG TERM GOAL #3   Title able to perform single leg agility exercises without leakage or pain so she is able to return to jogging    Time 12    Period Weeks    Status New    Target Date 03/22/20      PT LONG TERM GOAL #4   Title able to lift her children from the floor with correct body mechanics and no difficulty due to incresaed core and pelvic floor strength    Time 12    Period Weeks    Status New    Target Date 03/22/20                  Plan - 12/29/19 1352    Clinical Impression Statement Patient is a 39 year old female who has pelvic floor weakness and generalized weakness. She gave vaginal birth on 10/14/2019. Patient reports she is not having pain at this time but things feel loose. Patient tried to  jog several feet and discomfort in abdomen. Diastasis Recti 1 finger width above and below umbilicus. Patient has difficulty with engaging the transverse abdominus. Patient hip strength is 4/5. Right ilium is rotated posteriorly. T4-T10 decresaed mobility and decreased mobility in lower rib cage. Lumbar extension, right rotation, and left sidebending decreased by 25%. Patient has to splint to have a bowel movement. 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    Personal Factors and Comorbidities Fitness;Profession    Examination-Activity Limitations Toileting    Stability/Clinical Decision Making Stable/Uncomplicated    Clinical Decision Making Low    Rehab Potential Excellent    PT Frequency 2x / week    PT Duration 12 weeks    PT Treatment/Interventions ADLs/Self Care Home Management;Biofeedback;Therapeutic activities;Therapeutic exercise;Neuromuscular re-education;Manual techniques;Patient/family education;Spinal Manipulations    PT Next Visit Plan check pelvic alignment; rib cage mobilization with yoga rotation in sitting; Dead bug; bridge; gluteal engagement; manual work to the pelvic floor    PT Home Exercise Plan Access Code: [email or web address]    Consulted and Agree with Plan of Care Patient           Patient will benefit from skilled therapeutic intervention in order to improve the following deficits and impairments:  Decreased coordination, Decreased range of motion, Increased fascial restricitons, Decreased endurance, Decreased strength, Decreased mobility, Decreased activity tolerance  Visit Diagnosis: Muscle weakness (generalized) - Plan: PT plan of care cert/re-cert  Other lack of coordination - Plan: PT plan of care cert/re-cert     Problem List Patient Active Problem List   Diagnosis Date Noted  . SVD (spontaneous vaginal delivery) 10/16/2019  . Normal postpartum course 10/16/2019  . Labor and delivery, indication for care 10/14/2019  . 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 Normal intrauterine pregnancy in third trimester 01/22/2018    Earlie Counts, PT 12/29/19 5:08 PM   [facility] [address], [place], [place], [address] Phone: [phone]   Fax:  [phone]  Name: Natalie Jensen MRN: [id number] Date of Birth: March 12, 1981

## 2020-01-06 ENCOUNTER — Encounter: Payer: Self-pay | Admitting: Physical Therapy

## 2020-01-06 ENCOUNTER — Other Ambulatory Visit: Payer: Self-pay

## 2020-01-06 ENCOUNTER — Ambulatory Visit: Payer: No Typology Code available for payment source | Attending: Obstetrics & Gynecology | Admitting: Physical Therapy

## 2020-01-06 DIAGNOSIS — R278 Other lack of coordination: Secondary | ICD-10-CM | POA: Diagnosis present

## 2020-01-06 DIAGNOSIS — M6281 Muscle weakness (generalized): Secondary | ICD-10-CM | POA: Diagnosis present

## 2020-01-06 NOTE — Patient Instructions (Signed)
Access Code: [email or web address] URL: [email or web address] Date: 01/06/2020 Prepared by: Earlie Counts  Program Notes laying on back with knees bent breath into the lower rib cage and then into the pelvic floor to bulge 5 times    Exercises Supine Pelvic Floor Contraction - 3 x daily - 7 x weekly - 1 sets - 10 reps - 5 sec hold Supine Transversus Abdominis Bracing - Hands on Stomach - 1 x daily - 7 x weekly - 1 sets - 10 reps - 5 sec hold Bent Knee Fallouts - 1 x daily - 7 x weekly - 1 sets - 10 reps Curl Up with Reach - 1 x daily - 7 x weekly - 1 sets - 10 reps Forearm Plank on Wall - 1 x daily - 7 x weekly - 1 sets - 5 reps - 15 sec 1 min hold Standing Plank on Wall with Reaches - 1 x daily - 7 x weekly - 1 sets - 10 reps Plank on Knees - 1 x daily - 7 x weekly - 1 sets - 5 reps - 30 sec hold Squat - 1 x daily - 7 x weekly - 3 sets - 10 reps [facility] [address], [place], [place] [address] Phone # [phone] Fax [phone]

## 2020-01-06 NOTE — Therapy (Signed)
[facility] [address], [place], [place], [address] Phone: [phone]   Fax:  [phone]  Physical Therapy Treatment  Patient Details  Name: Natalie Jensen MRN: [id number] Date of Birth: 21-Jul-1980 Referring Provider (PT): Dr. Janyth Pupa   Encounter Date: 01/06/2020   PT End of Session - 01/06/20 1528    Visit Number 2    Date for PT Re-Evaluation 03/22/20    Authorization Type UHC    PT Start Time 4235    PT Stop Time 1525    PT Time Calculation (min) 40 min    Activity Tolerance Patient tolerated treatment well    Behavior During Therapy [facility] for tasks assessed/performed           Past Medical History:  Diagnosis Date  . Medical history non-contributory     Past Surgical History:  Procedure Laterality Date  . ivf      There were no vitals filed for this visit.   Subjective Assessment - 01/06/20 1450    Subjective No changes since last visit.    Patient Stated Goals Determine what my weakness is and how to improve it.    Currently in Pain? No/denies              [facility] PT Assessment - 01/06/20 0001      Palpation   SI assessment  ASIS is equal                         [facility] Adult PT Treatment/Exercise - 01/06/20 0001      Neuro Re-ed    Neuro Re-ed Details  with breath moving the rib angle with expanding the lower rib cage      Lumbar Exercises: Standing   Other Standing Lumbar Exercises wall plankes with core engagement and post. pelvic tilt    Other Standing Lumbar Exercises squats with post tillt, not ending in an ant. tilt      Lumbar Exercises: Supine   Ab Set 5 reps;5 seconds    AB Set Limitations with good abdominal engagement but effort    Clam 15 reps;1 second    Other Supine Lumbar Exercises supine with bulging the pelvic floor with tactile cues       Lumbar Exercises: Sidelying   Other Sidelying Lumbar Exercises side plank 1 time eack side      Lumbar  Exercises: Prone   Other Prone Lumbar Exercises modified plank with elbows against wall                  PT Education - 01/06/20 1523    Education Details Access Code: [email or web address]    Person(s) Educated Patient    Methods Explanation;Demonstration;Verbal cues;Handout    Comprehension Returned demonstration;Verbalized understanding            PT Short Term Goals - 12/29/19 1526      PT SHORT TERM GOAL #1   Title independent with initial HEP    Time 4    Period Weeks    Status New    Target Date 01/26/20      PT SHORT TERM GOAL #2   Title able to engage her TA correctly to prevent bulge of the abdomen with abdominal exercise    Time 4    Period Weeks    Status New    Target Date 01/26/20             PT Long Term  Goals - 12/29/19 1529      PT LONG TERM GOAL #1   Title independent with advanced HEP    Time 12    Period Weeks    Status New    Target Date 03/22/20      PT LONG TERM GOAL #2   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 to increased in core strength and pelvic floor strength >/= 4/5    Time 12    Period Weeks    Status New    Target Date 03/22/20      PT LONG TERM GOAL #3   Title able to perform single leg agility exercises without leakage or pain so she is able to return to jogging    Time 12    Period Weeks    Status New    Target Date 03/22/20      PT LONG TERM GOAL #4   Title able to lift her children from the floor with correct body mechanics and no difficulty due to incresaed core and pelvic floor strength    Time 12    Period Weeks    Status New    Target Date 03/22/20                 Plan - 01/06/20 1528    Clinical Impression Statement Patient has learned how to bulge her pelvic floor and still needs tactile cues. Patient has learned how to engage her core with exercises she will have to do in her exercise class and how to modify it. Patient pelvis in correct alignment. Patient still needs instruction  on toileting. 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    Personal Factors and Comorbidities Fitness;Profession    Examination-Activity Limitations Toileting    Stability/Clinical Decision Making Stable/Uncomplicated    Rehab Potential Excellent    PT Frequency 2x / week    PT Duration 12 weeks    PT Treatment/Interventions ADLs/Self Care Home Management;Biofeedback;Therapeutic activities;Therapeutic exercise;Neuromuscular re-education;Manual techniques;Patient/family education;Spinal Manipulations    PT Next Visit Plan manual work to pelvic floor for correct contraction and how to bulge the pelvic floor; go over past exercises    PT Home Exercise Plan Access Code: [email or web address]    Consulted and Agree with Plan of Care Patient           Patient will benefit from skilled therapeutic intervention in order to improve the following deficits and impairments:  Decreased coordination, Decreased range of motion, Increased fascial restricitons, Decreased endurance, Decreased strength, Decreased mobility, Decreased activity tolerance  Visit Diagnosis: Muscle weakness (generalized)  Other lack of coordination     Problem List Patient Active Problem List   Diagnosis Date Noted  . SVD (spontaneous vaginal delivery) 10/16/2019  . Normal postpartum course 10/16/2019  . Labor and delivery, indication for care 10/14/2019  . 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 Normal intrauterine pregnancy in third trimester 01/22/2018    Earlie Counts, PT 01/06/20 3:32 PM   [facility] [address], [place], [place], [address] Phone: [phone]   Fax:  [phone]  Name: Natalie Jensen MRN: [id number] Date of Birth: March 23, 1981

## 2020-01-12 ENCOUNTER — Ambulatory Visit: Payer: No Typology Code available for payment source | Admitting: Physical Therapy

## 2020-01-19 ENCOUNTER — Ambulatory Visit: Payer: No Typology Code available for payment source | Admitting: Physical Therapy

## 2020-01-19 ENCOUNTER — Other Ambulatory Visit: Payer: Self-pay

## 2020-01-19 ENCOUNTER — Encounter: Payer: Self-pay | Admitting: Physical Therapy

## 2020-01-19 DIAGNOSIS — M6281 Muscle weakness (generalized): Secondary | ICD-10-CM | POA: Diagnosis not present

## 2020-01-19 DIAGNOSIS — R278 Other lack of coordination: Secondary | ICD-10-CM

## 2020-01-19 NOTE — Therapy (Signed)
[facility] [address] [place], [place], [address] Phone: [phone]   Fax:  [phone]  Physical Therapy Treatment  Patient Details  Name: Natalie Jensen MRN: [id number] Date of Birth: 1980-12-30 Referring Provider (PT): Dr. Janyth Pupa   Encounter Date: 01/19/2020   PT End of Session - 01/19/20 1624    Visit Number 3    Date for PT Re-Evaluation 03/22/20    Authorization Type UHC    Authorization - Visit Number 3    Authorization - Number of Visits 30    PT Start Time 3295    PT Stop Time 1620    PT Time Calculation (min) 40 min    Activity Tolerance Patient tolerated treatment well    Behavior During Therapy [facility] for tasks assessed/performed           Past Medical History:  Diagnosis Date  . Medical history non-contributory     Past Surgical History:  Procedure Laterality Date  . ivf      There were no vitals filed for this visit.   Subjective Assessment - 01/19/20 1542    Subjective I feel better. I have done more fitness. Biggest challenge is when I am not being mindful of contracting. When I move more quickly it is harder to engage the abdominals correctly.    Patient Stated Goals Determine what my weakness is and how to improve it.    Currently in Pain? No/denies                          Pelvic Floor Special Questions - 01/19/20 0001    Pelvic Floor Internal Exam Patient confirms identification and approves PT to assess pelvic floor and treatment    Exam Type Vaginal    Strength fair squeeze, definite lift             [facility] Adult PT Treatment/Exercise - 01/19/20 0001      Therapeutic Activites    Therapeutic Activities Other Therapeutic Activities    Other Therapeutic Activities education on proper breathing for a bowel movement, with elongation of the pelvic floor with diaphragmatic breathing then breath out with a low noise to engage the lower abdominal and relax  the pelvic floor      Neuro Re-ed    Neuro Re-ed Details  with therapist finger in the vaginal canal and elongating the pelvic floor as she breaths out to bulge the pelvic floor in sidely      Manual Therapy   Manual Therapy Internal Pelvic Floor    Internal Pelvic Floor soft tissue work to the bulbocavernsosus, perineal body, puborectalis, and superficial transverse followed by moving the tissue up and down to elongate   sidely                 PT Education - 01/19/20 1624    Education Details bulging of the pelvic floor and toileting    Person(s) Educated Patient    Methods Explanation;Demonstration    Comprehension Verbalized understanding;Returned demonstration            PT Short Term Goals - 01/19/20 1629      PT SHORT TERM GOAL #1   Title independent with initial HEP    Time 4    Period Weeks    Status Achieved      PT SHORT TERM GOAL #2   Title able to engage her TA correctly to prevent bulge of the  abdomen with abdominal exercise    Time 4    Period Weeks    Status Achieved             PT Long Term Goals - 12/29/19 1529      PT LONG TERM GOAL #1   Title independent with advanced HEP    Time 12    Period Weeks    Status New    Target Date 03/22/20      PT LONG TERM GOAL #2   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 to increased in core strength and pelvic floor strength >/= 4/5    Time 12    Period Weeks    Status New    Target Date 03/22/20      PT LONG TERM GOAL #3   Title able to perform single leg agility exercises without leakage or pain so she is able to return to jogging    Time 12    Period Weeks    Status New    Target Date 03/22/20      PT LONG TERM GOAL #4   Title able to lift her children from the floor with correct body mechanics and no difficulty due to incresaed core and pelvic floor strength    Time 12    Period Weeks    Status New    Target Date 03/22/20                 Plan - 01/19/20  1625    Clinical Impression Statement Patient was having trouble with pushing out bowel movements. Today we focused on elongation of the pelvic floor and how to toilet correctly. Pelvic floor strength increased to 3/5. After manual work she was able to elongate the pelvic floor with breath for the first time and use it for toileting. 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    Personal Factors and Comorbidities Fitness;Profession    Examination-Activity Limitations Toileting    Stability/Clinical Decision Making Stable/Uncomplicated    Rehab Potential Excellent    PT Frequency 2x / week    PT Duration 12 weeks    PT Treatment/Interventions ADLs/Self Care Home Management;Biofeedback;Therapeutic activities;Therapeutic exercise;Neuromuscular re-education;Manual techniques;Patient/family education;Spinal Manipulations    PT Next Visit Plan see how bowel movements are doing, check diastasis recti, include core cross body movement, go over criteria to return to running    PT Home Exercise Plan Access Code: [email or web address]    Consulted and Agree with Plan of Care Patient           Patient will benefit from skilled therapeutic intervention in order to improve the following deficits and impairments:  Decreased coordination, Decreased range of motion, Increased fascial restricitons, Decreased endurance, Decreased strength, Decreased mobility, Decreased activity tolerance  Visit Diagnosis: Muscle weakness (generalized)  Other lack of coordination     Problem List Patient Active Problem List   Diagnosis Date Noted  . SVD (spontaneous vaginal delivery) 10/16/2019  . Normal postpartum course 10/16/2019  . Labor and delivery, indication for care 10/14/2019  . 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 Normal intrauterine pregnancy in third trimester 01/22/2018    Earlie Counts, PT 01/19/20 4:30 PM   [facility] [address], [place], [place], [address]  Phone: [phone]   Fax:  [phone]  Name: Natalie Jensen MRN: [id number] Date of Birth: 05-Jun-1980

## 2020-01-26 ENCOUNTER — Encounter: Payer: No Typology Code available for payment source | Admitting: Physical Therapy

## 2020-02-03 ENCOUNTER — Ambulatory Visit: Payer: No Typology Code available for payment source | Admitting: Physical Therapy

## 2020-02-03 ENCOUNTER — Encounter: Payer: Self-pay | Admitting: Physical Therapy

## 2020-02-03 ENCOUNTER — Other Ambulatory Visit: Payer: Self-pay

## 2020-02-03 DIAGNOSIS — R278 Other lack of coordination: Secondary | ICD-10-CM

## 2020-02-03 DIAGNOSIS — M6281 Muscle weakness (generalized): Secondary | ICD-10-CM | POA: Diagnosis not present

## 2020-02-03 NOTE — Patient Instructions (Addendum)
Return to Running Criteria (from [place], 2019)  Ability to perform 20-30 repetitions of each of the following without fatigue: Single calf raise (can start with fingertips on wall as needed for support) Single leg bridge Single leg sit to stand Sidelying hip abduction (can start with fingertips on wall as needed for support)   FOR INDIVIDUALS WITH PELVIC FLOOR SYMPTOMS: Ability to perform without leaking or prolapse symptoms: Walk 30 minutes Single leg balance 30 seconds Single leg squat 10 repetitions (2-3 sets) Jog in place 1 minute Forward bounding 10 repetitions Single leg hop 10 repetitions Single leg runners 10 repetitions   Toileting Techniques for Bowel Movements    An Evacuation/Defecation Plan   Here are the 4 basic points:  1. Lean forward enough for your elbows to rest on your knees 2. Support your feet on the floor or use a low stool if your feet don't touch the floor  3. Push out your belly as if you have swallowed a beach ball--you should feel a widening of your waist. "Belly Big, Belly Hard" 4. Open and relax your pelvic floor muscles, rather than tightening around the anus  While you are sitting on the toilet pay attention to the following areas: . Jaw and mouth position- relaxed not clenched . Angle of your hips - leaning slightly forward . Whether your feet touch the ground or not - should be flat and supported . Arm placement - rest against your thighs . Spine position - flat back . Waist . Breathing - exhale as you push (like blowing up a balloon or try using other sounds such as ahhhh, shhhhh, ohhhh or grrrrrrr) . Belly - hard and tight as you push . Anus (opening of the anal canal) - relaxed and open as you push . Anus - Tighten and lift pulling the muscle back in after you are done or if taking a break  If you are not successful after 10-15 minutes, try again later.  Avoid negative self-talk about your toileting experience.   Read this for  more details and ask your PT if you need suggestions for adjustments or limitations:  1) Sitting on the toilet  a) Make sure your feet are supported - flat on the floor or step stool b) Many people find it effective to lean forward or raise their knees.  Propping your feet on a step stool (Squatty Potty is a brand name) can help the muscles around the anus to relax  c) When you lean forward, place your forearms on your thighs for support  2) Relaxing a) Breathe deeply and slowly in through your nose and out through your mouth. b) To become aware of how to relax your muscles, contracting and releasing muscles can be helpful.  Pull your pelvic floor muscles in tightly by using the image of holding back gas, or closing around the anus (visualize making a circle smaller) and lifting the anus up and in.  Then release the muscles and your anus should drop down and feel open. Repeat 5 times ending with the feeling of relaxation. c) Keep your pelvic floor muscles relaxed; let your belly bulge out. d) The digestive tract starts at the mouth and ends at the anal opening, so be sure to relax both ends of the tube.  Place your tongue on the roof of your mouth with your teeth separated.  This helps relax your mouth and will help to relax the anus at the same time.  3) Emptying (defecation)  a) Keep your pelvic floor and sphincter relaxed, then bulge your anal muscles.  Make the anal opening wide.  b) Stick your belly out as if you have swallowed a beach ball. c) Make your belly wall hard using your belly muscles while continuing to breathe. Doing this makes it easier to open your anus. d) Breath out and give a grunt (or try using other sounds such as ahhhh, shhhhh, ohhhh or grrrrrrr). e)  Can also try to act as if you are blowing up a balloon as you push  4) Finishing a) As you finish your bowel movement, pull the pelvic floor muscles up and in.  This will leave your anus in the proper place rather than  remaining pushed out and down. If you leave your anus pushed out and down, it will start to feel as though that is normal and give you incorrect signals about needing to have a bowel movement. Access Code: [email or web address] URL: [email or web address] Date: 02/03/2020 Prepared by: Earlie Counts  Program Notes laying on back with knees bent breath into the lower rib cage and then into the pelvic floor to bulge 5 times    Exercises Supine Pelvic Floor Contraction - 3 x daily - 7 x weekly - 1 sets - 10 reps - 5 sec hold Supine Transversus Abdominis Bracing - Hands on Stomach - 1 x daily - 7 x weekly - 1 sets - 10 reps - 5 sec hold Bent Knee Fallouts - 1 x daily - 7 x weekly - 1 sets - 10 reps Curl Up with Reach - 1 x daily - 7 x weekly - 1 sets - 10 reps Forearm Plank on Wall - 1 x daily - 7 x weekly - 1 sets - 5 reps - 15 sec 1 min hold Standing Plank on Wall with Reaches - 1 x daily - 7 x weekly - 1 sets - 10 reps Plank on Knees - 1 x daily - 7 x weekly - 1 sets - 5 reps - 30 sec hold Squat - 1 x daily - 7 x weekly - 3 sets - 10 reps Single Leg Squat with Chair Touch - 1 x daily - 7 x weekly - 3 sets - 10 reps Single Leg Bridge - 1 x daily - 7 x weekly - 3 sets - 10 reps Standing Single Leg Heel Raise - 1 x daily - 7 x weekly - 3 sets - 10 reps Standing Repeated Hip Abduction with Ankle Weight - 1 x daily - 7 x weekly - 3 sets - 10 reps [facility] [address], [place],  [address] Phone # [phone] Fax [phone]

## 2020-02-03 NOTE — Therapy (Signed)
[facility] [address], [place], [place], [address] Phone: [phone]   Fax:  [phone]  Physical Therapy Treatment  Patient Details  Name: Natalie Jensen MRN: [id number] Date of Birth: Jun 01, 1980 Referring Provider (PT): Dr. Janyth Pupa   Encounter Date: 02/03/2020   PT End of Session - 02/03/20 1716    Visit Number 4    Date for PT Re-Evaluation 03/22/20    Authorization Type UHC    Authorization - Visit Number 4    Authorization - Number of Visits 30    PT Start Time 4270    PT Stop Time 1525    PT Time Calculation (min) 40 min    Activity Tolerance Patient tolerated treatment well    Behavior During Therapy [facility] for tasks assessed/performed           Past Medical History:  Diagnosis Date  . Medical history non-contributory     Past Surgical History:  Procedure Laterality Date  . ivf      There were no vitals filed for this visit.   Subjective Assessment - 02/03/20 1455    Subjective I want to review the toileting technique. I want to go over the return to running. NO pain. I tried running and felt it in the pelvic floor. I am going to have my knee fixed.    Patient Stated Goals Determine what my weakness is and how to improve it.    Currently in Pain? No/denies                          Pelvic Floor Special Questions - 02/03/20 0001    Diastasis Recti 1 finger diastasis recti with good tension             [facility] Adult PT Treatment/Exercise - 02/03/20 0001      Therapeutic Activites    Therapeutic Activities Other Therapeutic Activities    Other Therapeutic Activities education on proper breathing for a bowel movement, with elongation of the pelvic floor with diaphragmatic breathing then breath out with a low noise to engage the lower abdominal and relax the pelvic floor      Lumbar Exercises: Standing   Heel Raises 20 reps   each leg   Other Standing Lumbar Exercises  single leg squats and how to progress with height as she gets stronger    Other Standing Lumbar Exercises hip abduction with foot  slides on the wall to engage the gluteus medius  bil.       Lumbar Exercises: Supine   Single Leg Bridge 10 reps    Bridge with Ball Squeeze Limitations each leg with core engagement    Other Supine Lumbar Exercises engage core to roll up with holding onto the knee,                   PT Education - 02/03/20 1716    Education Details Access Code: [email or web address]; toileting, diaphragmatic breathing    Person(s) Educated Patient    Methods Explanation;Demonstration;Verbal cues;Handout    Comprehension Returned demonstration;Verbalized understanding            PT Short Term Goals - 01/19/20 1629      PT SHORT TERM GOAL #1   Title independent with initial HEP    Time 4    Period Weeks    Status Achieved      PT SHORT TERM GOAL #2   Title able to  engage her TA correctly to prevent bulge of the abdomen with abdominal exercise    Time 4    Period Weeks    Status Achieved             PT Long Term Goals - 02/03/20 1721      PT LONG TERM GOAL #1   Title independent with advanced HEP    Time 12    Period Weeks    Status On-going      PT LONG TERM GOAL #2   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 to increased in core strength and pelvic floor strength >/= 4/5    Time 12    Period Weeks    Status On-going      PT LONG TERM GOAL #3   Title able to perform single leg agility exercises without leakage or pain so she is able to return to jogging    Time 12    Period Weeks    Status On-going      PT LONG TERM GOAL #4   Title able to lift her children from the floor with correct body mechanics and no difficulty due to incresaed core and pelvic floor strength    Time 12    Period Weeks    Status On-going                 Plan - 02/03/20 1718    Clinical Impression Statement Patient was able to demonstrate  relaxation of the pelvic floor with toileting when the therapist gave her tactile cues. Patient was educated on exercises that would be good to do to return to running. During the exericses she needed tactile cues to engage the lower abdomen. Her diastsis recti is 1 finger width with good muscle tension. 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    Personal Factors and Comorbidities Fitness;Profession    Examination-Activity Limitations Toileting    Stability/Clinical Decision Making Stable/Uncomplicated    Rehab Potential Excellent    PT Frequency 2x / week    PT Duration 12 weeks    PT Treatment/Interventions ADLs/Self Care Home Management;Biofeedback;Therapeutic activities;Therapeutic exercise;Neuromuscular re-education;Manual techniques;Patient/family education;Spinal Manipulations    PT Next Visit Plan weight resistance exercises at the gym, cross body exercises    PT Home Exercise Plan Access Code: [email or web address]    Consulted and Agree with Plan of Care Patient           Patient will benefit from skilled therapeutic intervention in order to improve the following deficits and impairments:  Decreased coordination, Decreased range of motion, Increased fascial restricitons, Decreased endurance, Decreased strength, Decreased mobility, Decreased activity tolerance  Visit Diagnosis: Muscle weakness (generalized)  Other lack of coordination     Problem List Patient Active Problem List   Diagnosis Date Noted  . SVD (spontaneous vaginal delivery) 10/16/2019  . Normal postpartum course 10/16/2019  . Labor and delivery, indication for care 10/14/2019  . 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 Normal intrauterine pregnancy in third trimester 01/22/2018    Earlie Counts, PT 02/03/20 5:22 PM   [facility] [address], [place], [place], [address] Phone: [phone]   Fax:  [phone]  Name: Natalie Jensen MRN: [id number] Date of Birth: 1980-06-02

## 2020-02-07 ENCOUNTER — Ambulatory Visit: Payer: No Typology Code available for payment source | Attending: Obstetrics & Gynecology | Admitting: Physical Therapy

## 2020-02-07 ENCOUNTER — Encounter: Payer: Self-pay | Admitting: Physical Therapy

## 2020-02-07 ENCOUNTER — Other Ambulatory Visit: Payer: Self-pay

## 2020-02-07 DIAGNOSIS — M6281 Muscle weakness (generalized): Secondary | ICD-10-CM | POA: Diagnosis not present

## 2020-02-07 DIAGNOSIS — R278 Other lack of coordination: Secondary | ICD-10-CM | POA: Diagnosis present

## 2020-02-07 NOTE — Therapy (Signed)
[facility] [address], [place], [place], [address] Phone: [phone]   Fax:  [phone]  Physical Therapy Treatment  Patient Details  Name: Natalie Jensen MRN: [id number] Date of Birth: [birth year] Referring Provider (PT): Dr. Janyth Pupa   Encounter Date: 02/07/2020   PT End of Session - 02/07/20 1442    Visit Number 5    Date for PT Re-Evaluation 03/22/20    Authorization Type UHC    Authorization - Visit Number 5    Authorization - Number of Visits 30    PT Start Time 1400    PT Stop Time 1440    PT Time Calculation (min) 40 min    Activity Tolerance Patient tolerated treatment well    Behavior During Therapy [facility] for tasks assessed/performed           Past Medical History:  Diagnosis Date  . Medical history non-contributory     Past Surgical History:  Procedure Laterality Date  . ivf      There were no vitals filed for this visit.   Subjective Assessment - 02/07/20 1404    Subjective I felt good after last visit and encouraged. No plevic floor pain. I did the breathing and pooping and did well. No urinary leakage.    Patient Stated Goals Determine what my weakness is and how to improve it.    Currently in Pain? No/denies                             [facility] Adult PT Treatment/Exercise - 02/07/20 0001      Lumbar Exercises: Standing   Other Standing Lumbar Exercises 1/2 knees diagonal with plyoball and working on small range      Lumbar Exercises: Supine   Ab Set 15 reps;5 seconds    AB Set Limitations many tactile cues to contract the upper and lower abdominals together without upper contracting more than the lower    Bent Knee Raise 20 reps;1 second    Bent Knee Raise Limitations keepding the lower abdominals engaged    Straight Leg Raise 5 reps;1 second    Straight Leg Raises Limitations with opposite arm movement and tactile cues to engage the lower abdominals       Lumbar Exercises: Sidelying   Other Sidelying Lumbar Exercises curl-up diagonal beg. range    Other Sidelying Lumbar Exercises side plank with ball squeeze to engage the lower abdominal                     PT Short Term Goals - 01/19/20 1629      PT SHORT TERM GOAL #1   Title independent with initial HEP    Time 4    Period Weeks    Status Achieved      PT SHORT TERM GOAL #2   Title able to engage her TA correctly to prevent bulge of the abdomen with abdominal exercise    Time 4    Period Weeks    Status Achieved             PT Long Term Goals - 02/07/20 1446      PT LONG TERM GOAL #4   Title able to lift her children from the floor with correct body mechanics and no difficulty due to incresaed core and pelvic floor strength    Time 12    Period Weeks    Status On-going  Plan - 02/07/20 1443    Clinical Impression Statement Patient is able to have a bowel movement with correct breating. Patient is not having urinary leakage. She is not having pain. Patient is learning how to engage her lower abdominals without overcontracting her upper ut still needs tactile and verbal cues. Patient is able to work in the midrange for abdominal strenght but not ready to go through the full range.  Patient does not have a recti diastasis now. 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    Personal Factors and Comorbidities Fitness;Profession    Examination-Activity Limitations Toileting    Stability/Clinical Decision Making Stable/Uncomplicated    Rehab Potential Excellent    PT Frequency 2x / week    PT Duration 12 weeks    PT Treatment/Interventions ADLs/Self Care Home Management;Biofeedback;Therapeutic activities;Therapeutic exercise;Neuromuscular re-education;Manual techniques;Patient/family education;Spinal Manipulations    PT Next Visit Plan weight  resistance exercises at the gym, cross body exercises    PT Home Exercise Plan Access Code: [email or web address]    Consulted and Agree with Plan of Care Patient           Patient will benefit from skilled therapeutic intervention in order to improve the following deficits and impairments:  Decreased coordination, Decreased range of motion, Increased fascial restricitons, Decreased endurance, Decreased strength, Decreased mobility, Decreased activity tolerance  Visit Diagnosis: Muscle weakness (generalized)  Other lack of coordination     Problem List Patient Active Problem List   Diagnosis Date Noted  . SVD (spontaneous vaginal delivery) 10/16/2019  . Normal postpartum course 10/16/2019  . Labor and delivery, indication for care 10/14/2019  . 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 Normal intrauterine pregnancy in third trimester 01/22/2018    Earlie Counts, PT 02/07/20 2:47 PM   [facility] [address] [place], [place], [address] Phone: [phone]   Fax:  [phone]  Name: Natalie Jensen MRN: [id number] Date of Birth: [birth year]

## 2020-02-14 ENCOUNTER — Encounter: Payer: Self-pay | Admitting: Physical Therapy

## 2020-02-14 ENCOUNTER — Other Ambulatory Visit: Payer: Self-pay

## 2020-02-14 ENCOUNTER — Ambulatory Visit: Payer: No Typology Code available for payment source | Admitting: Physical Therapy

## 2020-02-14 DIAGNOSIS — R278 Other lack of coordination: Secondary | ICD-10-CM

## 2020-02-14 DIAGNOSIS — M6281 Muscle weakness (generalized): Secondary | ICD-10-CM | POA: Diagnosis not present

## 2020-02-14 NOTE — Therapy (Signed)
[facility] [address] [place], [place], [address] Phone: [phone]   Fax:  [phone]  Physical Therapy Treatment  Patient Details  Name: Natalie Jensen MRN: [id number] Date of Birth: [birth year] Referring Provider (PT): Dr. Janyth Pupa   Encounter Date: 02/14/2020   PT End of Session - 02/14/20 1705    Visit Number 6    Date for PT Re-Evaluation 03/22/20    Authorization Type UHC    Authorization - Visit Number 6    Authorization - Number of Visits 30    PT Start Time 0488    PT Stop Time 1655    PT Time Calculation (min) 40 min    Activity Tolerance Patient tolerated treatment well    Behavior During Therapy [facility] for tasks assessed/performed           Past Medical History:  Diagnosis Date  . Medical history non-contributory     Past Surgical History:  Procedure Laterality Date  . ivf      There were no vitals filed for this visit.   Subjective Assessment - 02/14/20 1621    Subjective I feel pretty good with last visit.    Patient Stated Goals Determine what my weakness is and how to improve it.    Currently in Pain? No/denies                             [facility] Adult PT Treatment/Exercise - 02/14/20 0001      Neuro Re-ed    Neuro Re-ed Details  abdominal contraction with tactile cues, contracting the pelvic floor, and not overcontracting the upper abdominals then moving legs       Lumbar Exercises: Machines for Strengthening   Leg Press 130 pounds 2 feet 30x; right leg 50# 20x; and left      Lumbar Exercises: Standing   Forward Lunge 5 reps    Forward Lunge Limitations moving weighted ball diagonal    Other Standing Lumbar Exercises single leg squat with engaging the core    Other Standing Lumbar Exercises hip abduction with foot  slides on the wall to engage the gluteus medius  bil.       Lumbar Exercises: Sidelying   Other Sidelying Lumbar Exercises side plank  to  engage the lower abdominal                     PT Short Term Goals - 01/19/20 1629      PT SHORT TERM GOAL #1   Title independent with initial HEP    Time 4    Period Weeks    Status Achieved      PT SHORT TERM GOAL #2   Title able to engage her TA correctly to prevent bulge of the abdomen with abdominal exercise    Time 4    Period Weeks    Status Achieved             PT Long Term Goals - 02/14/20 1710      PT LONG TERM GOAL #1   Title independent with advanced HEP    Time 12    Period Weeks    Status On-going      PT LONG TERM GOAL #2   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 to increased in core strength and pelvic floor strength >/= 4/5    Time 12  Period Weeks    Status On-going      PT LONG TERM GOAL #3   Title able to perform single leg agility exercises without leakage or pain so she is able to return to jogging    Time 12    Period Weeks    Status Achieved      PT LONG TERM GOAL #4   Title able to lift her children from the floor with correct body mechanics and no difficulty due to incresaed core and pelvic floor strength    Time 12    Period Weeks    Status On-going                 Plan - 02/14/20 1619    Clinical Impression Statement Patient was able to have a bowel movement without straining. Patient continues to learn how to engage the abdominals with equal contraction of the upper and lower abdominals. Patient was able to demonstrate plank with correct abdominal contraction and needed minimal tactile cue to bring abdominal up and in. Patient will be having surgery on her left knee in November. 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    Personal Factors and Comorbidities Fitness;Profession    Examination-Activity Limitations Toileting    Stability/Clinical Decision Making Stable/Uncomplicated     Rehab Potential Excellent    PT Frequency 2x / week    PT Duration 12 weeks    PT Treatment/Interventions ADLs/Self Care Home Management;Biofeedback;Therapeutic activities;Therapeutic exercise;Neuromuscular re-education;Manual techniques;Patient/family education;Spinal Manipulations    PT Next Visit Plan weight resistance exercises at the gym, cross body exercises    PT Home Exercise Plan Access Code: [email or web address]    Consulted and Agree with Plan of Care Patient           Patient will benefit from skilled therapeutic intervention in order to improve the following deficits and impairments:  Decreased coordination, Decreased range of motion, Increased fascial restricitons, Decreased endurance, Decreased strength, Decreased mobility, Decreased activity tolerance  Visit Diagnosis: Muscle weakness (generalized)  Other lack of coordination     Problem List Patient Active Problem List   Diagnosis Date Noted  . SVD (spontaneous vaginal delivery) 10/16/2019  . Normal postpartum course 10/16/2019  . Labor and delivery, indication for care 10/14/2019  . 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 Normal intrauterine pregnancy in third trimester 01/22/2018    Earlie Counts, PT 02/14/20 5:11 PM   [facility] [address], [place], [place], [address] Phone: [phone]   Fax:  [phone]  Name: ARANTZA DARRINGTON MRN: [id number] Date of Birth: 05/26/1980

## 2020-02-24 ENCOUNTER — Telehealth: Payer: Self-pay | Admitting: Physical Therapy

## 2020-02-24 ENCOUNTER — Ambulatory Visit: Payer: No Typology Code available for payment source | Admitting: Physical Therapy

## 2020-02-24 NOTE — Telephone Encounter (Signed)
Called patient about today no-show at 14:00 appt. Left a message.  Earlie Counts, PT @10 /21/2021@ 2:26 PM

## 2020-02-28 ENCOUNTER — Other Ambulatory Visit: Payer: Self-pay

## 2020-02-28 ENCOUNTER — Ambulatory Visit: Payer: No Typology Code available for payment source | Admitting: Physical Therapy

## 2020-02-28 ENCOUNTER — Encounter: Payer: Self-pay | Admitting: Physical Therapy

## 2020-02-28 DIAGNOSIS — M6281 Muscle weakness (generalized): Secondary | ICD-10-CM | POA: Diagnosis not present

## 2020-02-28 DIAGNOSIS — R278 Other lack of coordination: Secondary | ICD-10-CM

## 2020-02-28 NOTE — Therapy (Signed)
[facility] [address] [place], [place], [address] Phone: [phone]   Fax:  [phone]  Physical Therapy Treatment  Patient Details  Name: Natalie Jensen MRN: [id number] Date of Birth: 1980-06-20 Referring Provider (PT): Dr. Janyth Pupa   Encounter Date: 02/28/2020   PT End of Session - 02/28/20 1620    Visit Number 7    Date for PT Re-Evaluation 04/19/20    Authorization Type UHC    Authorization - Visit Number 7    Authorization - Number of Visits 30    PT Start Time 2334    PT Stop Time 3568    PT Time Calculation (min) 38 min    Activity Tolerance Patient tolerated treatment well    Behavior During Therapy [facility] for tasks assessed/performed           Past Medical History:  Diagnosis Date  . Medical history non-contributory     Past Surgical History:  Procedure Laterality Date  . ivf      There were no vitals filed for this visit.   Subjective Assessment - 02/28/20 1618    Subjective I feel really good about everything.    Patient Stated Goals Determine what my weakness is and how to improve it.    Currently in Pain? No/denies              [facility] PT Assessment - 02/28/20 0001      Assessment   Medical Diagnosis M62.81 generalized muscle weakness; R10.2 perineal pain    Referring Provider (PT) Dr. Janyth Pupa    Onset Date/Surgical Date 10/14/19    Prior Therapy none      Precautions   Precautions None      Restrictions   Weight Bearing Restrictions No      Home Environment   Living Environment Private residence      Prior Function   Level of Independence Independent    Vocation Full time employment    Counsellor; Physiological scientist      Cognition   Overall Cognitive Status Within Functional Limits for tasks assessed      Strength   Right Hip Flexion 5/5    Right Hip ABduction 5/5    Right Hip ADduction 5/5    Left Hip Flexion 5/5    Left  Hip ABduction 5/5      Palpation   SI assessment  ASIS is equal    Palpation comment no rib flaring                       Pelvic Floor Special Questions - 02/28/20 0001    Diastasis Recti 1 finger diastasis recti with good tension    Urinary Leakage No    Strength fair squeeze, definite lift             [facility] Adult PT Treatment/Exercise - 02/28/20 0001      Self-Care   Self-Care Other Self-Care Comments    Other Self-Care Comments  education on vaginal weights to strengthen the pelvic floor      Exercises   Exercises Other Exercises    Other Exercises  Discussed with patient on how to progress her exercises as she is getting stronger, how to make sure she is engaging the lower abdominals with her current exercise program, how to do her exercises with her fitness class at work      Lumbar Exercises: Seated   Other Seated  Lumbar Exercises seated pelvic floor strengthening and holding for 10 seconds      Lumbar Exercises: Supine   Ab Set 10 reps;1 second    AB Set Limitations with cues to engage the deep core for no dooming    Bridge with clamshell 20 reps;1 second    Other Supine Lumbar Exercises curl-up with abodminal engagement with toe taping,                   PT Education - 02/28/20 1710    Education Details Access Code: [email or web address]; discussed with the progression of her exericses    Person(s) Educated Patient    Methods Explanation;Demonstration;Verbal cues;Handout    Comprehension Returned demonstration;Verbalized understanding            PT Short Term Goals - 01/19/20 1629      PT SHORT TERM GOAL #1   Title independent with initial HEP    Time 4    Period Weeks    Status Achieved      PT SHORT TERM GOAL #2   Title able to engage her TA correctly to prevent bulge of the abdomen with abdominal exercise    Time 4    Period Weeks    Status Achieved             PT Long Term Goals - 02/28/20 1718      PT LONG TERM GOAL #1   Title  independent with advanced HEP    Time 12    Status On-going      PT LONG TERM GOAL #2   Time 12    Period Weeks    Status On-going      PT LONG TERM GOAL #3   Title able to perform single leg agility exercises without leakage or pain so she is able to return to jogging    Time 12    Period Weeks    Status Achieved      PT LONG TERM GOAL #4   Title able to lift her children from the floor with correct body mechanics and no difficulty due to incresaed core and pelvic floor strength    Time 12    Period Weeks    Status Achieved                 Plan - 02/28/20 1712    Clinical Impression Statement Patient is not leaking urine. She is still concerned about her exericses and progression due to her stomach is still weak. She has 5/5 strength in hips. She is able to contract the pelvic floor and abdominals correctly when she intentionally contract. She will dome her abdominals if she is not thinking of the correct contraction. Patient has no outflare of the lower ribs. Pateint diastasis Recti is 1 finger width above the umbilicus. Patient will feel the urge to leak and has to contract her muscles to prevent instead of them being automatic. Patient will benefit from 1 more appoinment to see about her progression for her exercise program.    Personal Factors and Comorbidities Fitness;Profession    Examination-Activity Limitations Toileting    Stability/Clinical Decision Making Stable/Uncomplicated    Rehab Potential Excellent    PT Frequency 2x / week    PT Duration 12 weeks    PT Treatment/Interventions ADLs/Self Care Home Management;Biofeedback;Therapeutic activities;Therapeutic exercise;Neuromuscular re-education;Manual techniques;Patient/family education;Spinal Manipulations    PT Next Visit Plan see patient one more time on 12/8 to progress her exercise program and see if she  is confident in them    PT Home Exercise Plan Access Code: [email or web address]    Consulted and Agree with Plan of  Care Patient           Patient will benefit from skilled therapeutic intervention in order to improve the following deficits and impairments:  Decreased coordination, Decreased range of motion, Increased fascial restricitons, Decreased endurance, Decreased strength, Decreased mobility, Decreased activity tolerance  Visit Diagnosis: Muscle weakness (generalized) - Plan: PT plan of care cert/re-cert  Other lack of coordination - Plan: PT plan of care cert/re-cert     Problem List Patient Active Problem List   Diagnosis Date Noted  . SVD (spontaneous vaginal delivery) 10/16/2019  . Normal postpartum course 10/16/2019  . Labor and delivery, indication for care 10/14/2019  . 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 Normal intrauterine pregnancy in third trimester 01/22/2018    Earlie Counts, PT 02/28/20 5:21 PM  [facility] [address] [place], [place], [address] Phone: [phone]   Fax:  [phone]  Name: Natalie Jensen MRN: [id number] Date of Birth: March 25, 1981

## 2020-02-28 NOTE — Patient Instructions (Signed)
Access Code: [email or web address] URL: [email or web address] Date: 02/28/2020 Prepared by: Earlie Counts  Program Notes laying on back with knees bent breath into the lower rib cage and then into the pelvic floor to bulge 5 times    Exercises Supine Pelvic Floor Contraction - 3 x daily - 7 x weekly - 1 sets - 10 reps - 5 sec hold Supine Transversus Abdominis Bracing - Hands on Stomach - 1 x daily - 7 x weekly - 1 sets - 10 reps - 5 sec hold Bent Knee Fallouts - 1 x daily - 7 x weekly - 1 sets - 10 reps Curl Up with Reach - 1 x daily - 7 x weekly - 1 sets - 10 reps Forearm Plank on Wall - 1 x daily - 7 x weekly - 1 sets - 5 reps - 15 sec 1 min hold Standing Plank on Wall with Reaches - 1 x daily - 7 x weekly - 1 sets - 10 reps Plank on Knees - 1 x daily - 7 x weekly - 1 sets - 5 reps - 30 sec hold Squat - 1 x daily - 7 x weekly - 3 sets - 10 reps Single Leg Squat with Chair Touch - 1 x daily - 7 x weekly - 3 sets - 10 reps Single Leg Bridge - 1 x daily - 7 x weekly - 3 sets - 10 reps Standing Single Leg Heel Raise - 1 x daily - 7 x weekly - 3 sets - 10 reps Standing Repeated Hip Abduction with Ankle Weight - 1 x daily - 7 x weekly - 3 sets - 10 reps Seated Pelvic Floor Contraction - 2 x daily - 7 x weekly - 1 sets - 10 reps - 10-20 sec hold [facility] [address] [place], [place] [address] Phone # [phone] Fax [phone]

## 2020-03-06 ENCOUNTER — Encounter: Payer: Self-pay | Admitting: Physical Therapy

## 2020-04-12 ENCOUNTER — Other Ambulatory Visit: Payer: Self-pay

## 2020-04-12 ENCOUNTER — Ambulatory Visit: Payer: No Typology Code available for payment source | Attending: Obstetrics & Gynecology | Admitting: Physical Therapy

## 2020-04-12 ENCOUNTER — Encounter: Payer: Self-pay | Admitting: Physical Therapy

## 2020-04-12 DIAGNOSIS — R278 Other lack of coordination: Secondary | ICD-10-CM | POA: Diagnosis present

## 2020-04-12 DIAGNOSIS — M6281 Muscle weakness (generalized): Secondary | ICD-10-CM | POA: Diagnosis present

## 2020-04-12 NOTE — Therapy (Signed)
[facility] [address] [place], [place], [address] Phone: [phone]   Fax:  [phone]  Physical Therapy Treatment  Patient Details  Name: Natalie Jensen MRN: [id number] Date of Birth: 07/18/1980 Referring Provider (PT): Dr. Janyth Pupa   Encounter Date: 04/12/2020   PT End of Session - 04/12/20 1626    Visit Number 8    Date for PT Re-Evaluation 04/19/20    Authorization Type UHC    Authorization - Visit Number 8    Authorization - Number of Visits 30    PT Start Time 8453    PT Stop Time 1613    PT Time Calculation (min) 43 min    Activity Tolerance Patient tolerated treatment well    Behavior During Therapy [facility] for tasks assessed/performed           Past Medical History:  Diagnosis Date  . Medical history non-contributory     Past Surgical History:  Procedure Laterality Date  . ivf      There were no vitals filed for this visit.   Subjective Assessment - 04/12/20 1536    Subjective I want to see the Diastasis and see if there is anything else.    Patient Stated Goals Determine what my weakness is and how to improve it.    Currently in Pain? No/denies    Multiple Pain Sites No              [facility] PT Assessment - 04/12/20 0001      Assessment   Medical Diagnosis M62.81 generalized muscle weakness; R10.2 perineal pain    Referring Provider (PT) Dr. Janyth Pupa    Onset Date/Surgical Date 10/14/19    Prior Therapy none      Precautions   Precautions None      Restrictions   Weight Bearing Restrictions No      Home Environment   Living Environment Private residence      Prior Function   Level of Independence Independent    Vocation Full time employment    Counsellor; Physiological scientist      Cognition   Overall Cognitive Status Within Functional Limits for tasks assessed      Posture/Postural Control   Posture/Postural Control No significant  limitations      ROM / Strength   AROM / PROM / Strength AROM;PROM;Strength      AROM   Lumbar Extension full    Lumbar - Left Side Bend full    Lumbar - Right Rotation full      Strength   Right Hip Flexion 5/5    Right Hip ABduction 5/5    Right Hip ADduction 5/5    Left Hip Flexion 5/5    Left Hip ABduction 5/5    Left Hip ADduction 5/5                      Pelvic Floor Special Questions - 04/12/20 0001    Diastasis Recti 0.5 finger above and below the umbilicus    Urinary Leakage No             [facility] Adult PT Treatment/Exercise - 04/12/20 0001      Exercises   Exercises Other Exercises    Other Exercises  verbally reviewed the exericses she is to do at home and her progression      Lumbar Exercises: Stretches   Hip Flexor Stretch Right;Left;1 rep;60 seconds  Hip Flexor Stretch Limitations on foam     ITB Stretch Right;Left;1 rep;60 seconds    ITB Stretch Limitations sidly on the foam roll    Other Lumbar Stretch Exercise hip flexor stretch with buttocks on the foam roll       Lumbar Exercises: Supine   Single Leg Bridge 5 reps;1 second    Bridge with [employer] Limitations each leg    Straight Leg Raise 10 reps;1 second    Straight Leg Raises Limitations engaging the core; right , left                    PT Short Term Goals - 04/12/20 1552      PT SHORT TERM GOAL #1   Title independent with initial HEP    Time 4    Period Weeks    Target Date 01/26/20      PT SHORT TERM GOAL #2   Title able to engage her TA correctly to prevent bulge of the abdomen with abdominal exercise    Time 4    Period Weeks    Status Achieved             PT Long Term Goals - 04/12/20 1538      PT LONG TERM GOAL #1   Title independent with advanced HEP    Time 12    Period Weeks    Status Achieved      PT LONG TERM GOAL #2   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 to increased in core strength and pelvic floor  strength >/= 4/5    Time 12    Period Weeks    Status Achieved      PT LONG TERM GOAL #3   Title able to perform single leg agility exercises without leakage or pain so she is able to return to jogging    Time 12    Period Weeks    Status Achieved      PT LONG TERM GOAL #4   Title able to lift her children from the floor with correct body mechanics and no difficulty due to incresaed core and pelvic floor strength    Time 12    Period Weeks    Status Achieved                 Plan - 04/12/20 1555    Clinical Impression Statement Patient has met her goals. Patient has full lumbar ROM. Patient has 5/5 strength. Patientis not have urinary leakage. Patient is able to engage the core correctly, Patient is able to keep her hips leveled with one leg stance. Patient Diastasis with 1/2 finger just above the ubilicus and below. Paitent is ready for discharge.    Personal Factors and Comorbidities Fitness;Profession    Examination-Activity Limitations Toileting    Stability/Clinical Decision Making Stable/Uncomplicated    Rehab Potential Excellent    PT Treatment/Interventions ADLs/Self Care Home Management;Biofeedback;Therapeutic activities;Therapeutic exercise;Neuromuscular re-education;Manual techniques;Patient/family education;Spinal Manipulations    PT Next Visit Plan Discharge to HEP    PT Home Exercise Plan Access Code: [email or web address]    Recommended Other Services MD signed initial summary    Consulted and Agree with Plan of Care Patient           Patient will benefit from skilled therapeutic intervention in order to improve the following deficits and impairments:  Decreased coordination, Decreased range of motion, Increased fascial restricitons, Decreased endurance, Decreased strength, Decreased mobility, Decreased activity tolerance  Visit Diagnosis: Muscle weakness (generalized)  Other lack of coordination     Problem List Patient Active Problem List   Diagnosis Date  Noted  . SVD (spontaneous vaginal delivery) 10/16/2019  . Normal postpartum course 10/16/2019  . Labor and delivery, indication for care 10/14/2019  . 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 Normal intrauterine pregnancy in third trimester 01/22/2018    Earlie Counts, PT 04/12/20 5:02 PM   [facility] [address] [place], [place], [address] Phone: [phone]   Fax:  [phone]  Name: NYREE APPLEGATE MRN: [id number] Date of Birth: 06-Jun-1980  PHYSICAL THERAPY DISCHARGE SUMMARY  Visits from Start of Care: 8  Current functional level related to goals / functional outcomes: See above.    Remaining deficits: See above.    Education / Equipment: HEP Plan: Patient agrees to discharge.  Patient goals were met. Patient is being discharged due to meeting the stated rehab goals.  Thank you for the referral. Earlie Counts, PT 04/12/20 5:03 PM  ?????

## 2020-04-24 ENCOUNTER — Other Ambulatory Visit: Payer: Self-pay

## 2020-04-24 ENCOUNTER — Encounter: Payer: Self-pay | Admitting: Dermatology

## 2020-04-24 ENCOUNTER — Ambulatory Visit (INDEPENDENT_AMBULATORY_CARE_PROVIDER_SITE_OTHER): Payer: No Typology Code available for payment source | Admitting: Dermatology

## 2020-04-24 DIAGNOSIS — B079 Viral wart, unspecified: Secondary | ICD-10-CM

## 2020-04-24 DIAGNOSIS — D1800 Hemangioma unspecified site: Secondary | ICD-10-CM

## 2020-04-24 DIAGNOSIS — Z1283 Encounter for screening for malignant neoplasm of skin: Secondary | ICD-10-CM

## 2020-04-24 NOTE — Patient Instructions (Signed)
Visit for Natalie Jensen date of birth 1981/03/25.  She has a history of having had warts on her feet and now has a solitary wart on the tip of the right index finger.  We discussed the lack of an antiviral antibiotic for warts along with the fact that the existing wart vaccine is specific for other warts and not effective for her hands and feet.  We did a roughly 10-second liquid nitrogen freeze on the spot and [name] will wait approximately 3 to 4 weeks.  If the area is not completely smooth she will look for an over-the-counter wart freeze from Compound W or Verruca-Freeze and use it every 1 to 3 weeks.  I have asked her to please contact me via MyChart or phone in 1 month for status report.

## 2020-04-24 NOTE — Progress Notes (Signed)
   Follow-Up Natalie   Subjective  Natalie Jensen is a 39 y.o. female who presents for the following: Skin Problem (? Wart- right index finger- x months- comes and goes/Patient is breastfeeding ).  Wart Location: Index finger Duration:  Quality:  Associated Signs/Symptoms: Modifying Factors:  Severity:  Timing: Context:   Objective  Well appearing patient in no apparent distress; mood and affect are within normal limits. Objective  Right 2nd Finger Tip: Palmar tip 3 mm hyperkeratotic papule; patient to shown dermoscopy typical of wart.  Objective  Mid Back: Waist up skin examination- no atypical moles or non mole skin cancer  Objective  Mid Back: Raised red 2 mm papule    A focused examination was performed including Hands, face, back.. Relevant physical exam findings are noted in the Assessment and Plan.   Assessment & Plan    Viral warts, unspecified type Right 2nd Finger Tip  10 close-up 1 second pulses of liquid nitrogen.  If not clear in 3 weeks, she will follow this with an over-the-counter wart freeze every 1 to 2 weeks.  Call for status report in 1 month.  Destruction of lesion - Right 2nd Finger Tip Complexity: simple   Destruction method: cryotherapy   Informed consent: discussed and consent obtained   Timeout:  patient name, date of birth, surgical site, and procedure verified Lesion destroyed using liquid nitrogen: Yes   Cryotherapy cycles:  3 Outcome: patient tolerated procedure well with no complications    Encounter for screening for malignant neoplasm of skin Mid Back  Encouraged to self examine with spouse twice annually.  Hemangioma, unspecified site Mid Back  No need for removal   Natalie Jensen date of birth Jul 30, 1980.  She has a history of having had warts on her feet and now has a solitary wart on the tip of the right index finger.  We discussed the lack of an antiviral antibiotic for warts along with the fact that the  existing wart vaccine is specific for other warts and not effective for her hands and feet.  We did a roughly 10-second liquid nitrogen freeze on the spot and Natalie Jensen will wait approximately 3 to 4 weeks.  If the area is not completely smooth she will look for an over-the-counter wart freeze from Compound W or Verruca-Freeze and use it every 1 to 3 weeks.  I have asked her to please contact me via MyChart or phone in 1 month for status report.   I, Lavonna Monarch, MD, have reviewed all documentation for this Natalie.  The documentation on 04/24/20 for the exam, diagnosis, procedures, and orders are all accurate and complete.

## 2020-06-16 ENCOUNTER — Other Ambulatory Visit: Payer: Self-pay | Admitting: Obstetrics and Gynecology

## 2020-06-16 DIAGNOSIS — Z1231 Encounter for screening mammogram for malignant neoplasm of breast: Secondary | ICD-10-CM

## 2022-03-01 ENCOUNTER — Other Ambulatory Visit (HOSPITAL_BASED_OUTPATIENT_CLINIC_OR_DEPARTMENT_OTHER): Payer: Self-pay

## 2022-03-01 MED ORDER — LISDEXAMFETAMINE DIMESYLATE 60 MG PO CAPS
60.0000 mg | ORAL_CAPSULE | Freq: Every day | ORAL | 0 refills | Status: DC
  Filled 2022-03-01: qty 30, 30d supply, fill #0

## 2022-03-14 ENCOUNTER — Other Ambulatory Visit (HOSPITAL_BASED_OUTPATIENT_CLINIC_OR_DEPARTMENT_OTHER): Payer: Self-pay

## 2022-03-14 MED ORDER — SERTRALINE HCL 50 MG PO TABS
ORAL_TABLET | ORAL | 0 refills | Status: AC
Start: 2022-03-14 — End: 2022-06-21
  Filled 2022-03-14: qty 135, 90d supply, fill #0

## 2022-03-15 ENCOUNTER — Other Ambulatory Visit (HOSPITAL_BASED_OUTPATIENT_CLINIC_OR_DEPARTMENT_OTHER): Payer: Self-pay

## 2022-03-15 MED ORDER — AMPHETAMINE-DEXTROAMPHETAMINE 10 MG PO TABS
10.0000 mg | ORAL_TABLET | Freq: Every day | ORAL | 0 refills | Status: AC
  Filled 2022-03-15: qty 60, 30d supply, fill #0
  Filled 2022-04-06: qty 50, 25d supply, fill #0
  Filled 2022-04-06: qty 10, 5d supply, fill #0

## 2022-03-22 ENCOUNTER — Other Ambulatory Visit (HOSPITAL_BASED_OUTPATIENT_CLINIC_OR_DEPARTMENT_OTHER): Payer: Self-pay

## 2022-03-27 ENCOUNTER — Other Ambulatory Visit (HOSPITAL_BASED_OUTPATIENT_CLINIC_OR_DEPARTMENT_OTHER): Payer: Self-pay

## 2022-04-01 ENCOUNTER — Other Ambulatory Visit (HOSPITAL_BASED_OUTPATIENT_CLINIC_OR_DEPARTMENT_OTHER): Payer: Self-pay

## 2022-04-01 MED ORDER — LISDEXAMFETAMINE DIMESYLATE 60 MG PO CAPS
60.0000 mg | ORAL_CAPSULE | Freq: Every day | ORAL | 0 refills | Status: DC
Start: 2022-04-01 — End: 2022-05-02
  Filled 2022-04-01: qty 30, 30d supply, fill #0

## 2022-04-06 ENCOUNTER — Other Ambulatory Visit (HOSPITAL_BASED_OUTPATIENT_CLINIC_OR_DEPARTMENT_OTHER): Payer: Self-pay

## 2022-04-08 ENCOUNTER — Other Ambulatory Visit (HOSPITAL_BASED_OUTPATIENT_CLINIC_OR_DEPARTMENT_OTHER): Payer: Self-pay

## 2022-05-02 ENCOUNTER — Other Ambulatory Visit (HOSPITAL_BASED_OUTPATIENT_CLINIC_OR_DEPARTMENT_OTHER): Payer: Self-pay

## 2022-05-02 MED ORDER — LISDEXAMFETAMINE DIMESYLATE 60 MG PO CAPS
60.0000 mg | ORAL_CAPSULE | Freq: Every day | ORAL | 0 refills | Status: DC
  Filled 2022-05-02: qty 30, 30d supply, fill #0

## 2022-05-07 ENCOUNTER — Other Ambulatory Visit (HOSPITAL_BASED_OUTPATIENT_CLINIC_OR_DEPARTMENT_OTHER): Payer: Self-pay

## 2022-06-07 ENCOUNTER — Encounter (HOSPITAL_BASED_OUTPATIENT_CLINIC_OR_DEPARTMENT_OTHER): Payer: Self-pay | Admitting: Pharmacist

## 2022-06-07 ENCOUNTER — Other Ambulatory Visit (HOSPITAL_BASED_OUTPATIENT_CLINIC_OR_DEPARTMENT_OTHER): Payer: Self-pay

## 2022-06-07 MED ORDER — LISDEXAMFETAMINE DIMESYLATE 60 MG PO CAPS
60.0000 mg | ORAL_CAPSULE | Freq: Every day | ORAL | 0 refills | Status: AC
  Filled 2022-06-07: qty 30, 30d supply, fill #0

## 2022-06-11 ENCOUNTER — Other Ambulatory Visit: Payer: Self-pay

## 2022-06-12 ENCOUNTER — Other Ambulatory Visit (HOSPITAL_BASED_OUTPATIENT_CLINIC_OR_DEPARTMENT_OTHER): Payer: Self-pay

## 2022-06-16 ENCOUNTER — Other Ambulatory Visit (HOSPITAL_BASED_OUTPATIENT_CLINIC_OR_DEPARTMENT_OTHER): Payer: Self-pay

## 2022-06-17 ENCOUNTER — Other Ambulatory Visit (HOSPITAL_BASED_OUTPATIENT_CLINIC_OR_DEPARTMENT_OTHER): Payer: Self-pay

## 2022-06-18 ENCOUNTER — Other Ambulatory Visit: Payer: Self-pay

## 2022-06-27 ENCOUNTER — Other Ambulatory Visit (HOSPITAL_COMMUNITY): Payer: Self-pay

## 2022-06-27 ENCOUNTER — Other Ambulatory Visit (HOSPITAL_BASED_OUTPATIENT_CLINIC_OR_DEPARTMENT_OTHER): Payer: Self-pay

## 2022-06-27 MED ORDER — LISDEXAMFETAMINE DIMESYLATE 60 MG PO CAPS
60.0000 mg | ORAL_CAPSULE | Freq: Every day | ORAL | 0 refills | Status: AC
  Filled 2022-06-27 – 2022-07-01 (×2): qty 30, 30d supply, fill #0

## 2022-06-27 MED ORDER — AZSTARYS 52.3-10.4 MG PO CAPS
1.0000 | ORAL_CAPSULE | Freq: Every day | ORAL | 0 refills | Status: AC
  Filled 2022-06-27: qty 30, 30d supply, fill #0

## 2022-06-28 ENCOUNTER — Other Ambulatory Visit (HOSPITAL_COMMUNITY): Payer: Self-pay

## 2022-07-01 ENCOUNTER — Other Ambulatory Visit (HOSPITAL_COMMUNITY): Payer: Self-pay
